# Patient Record
Sex: Female | Born: 1995
Health system: Southern US, Community
[De-identification: ages and names within clinical notes are randomized; demographics above are authoritative.]

## PROBLEM LIST (undated history)

## (undated) DIAGNOSIS — L732 Hidradenitis suppurativa: Secondary | ICD-10-CM

## (undated) DIAGNOSIS — K8020 Calculus of gallbladder without cholecystitis without obstruction: Secondary | ICD-10-CM

## (undated) HISTORY — DX: Hidradenitis suppurativa: L73.2

## (undated) HISTORY — PX: FOOT SURGERY: SHX648

## (undated) HISTORY — PX: TONSILLECTOMY: SUR1361

---
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
## 2017-01-29 DIAGNOSIS — G44229 Chronic tension-type headache, not intractable: Secondary | ICD-10-CM | POA: Insufficient documentation

## 2017-03-30 ENCOUNTER — Other Ambulatory Visit: Payer: Self-pay | Admitting: Nurse Practitioner

## 2017-03-30 DIAGNOSIS — G44201 Tension-type headache, unspecified, intractable: Secondary | ICD-10-CM

## 2017-04-08 ENCOUNTER — Other Ambulatory Visit: Payer: Self-pay | Admitting: Medical Oncology

## 2017-04-08 DIAGNOSIS — R945 Abnormal results of liver function studies: Principal | ICD-10-CM

## 2017-04-08 DIAGNOSIS — R7989 Other specified abnormal findings of blood chemistry: Secondary | ICD-10-CM

## 2017-04-08 DIAGNOSIS — R1084 Generalized abdominal pain: Secondary | ICD-10-CM

## 2017-04-12 ENCOUNTER — Ambulatory Visit
Admission: RE | Admit: 2017-04-12 | Discharge: 2017-04-12 | Disposition: A | Discharge status: Home / Self Care | Payer: No Typology Code available for payment source | Source: Ambulatory Visit | Attending: Nurse Practitioner | Admitting: Nurse Practitioner

## 2017-04-12 DIAGNOSIS — G44201 Tension-type headache, unspecified, intractable: Secondary | ICD-10-CM | POA: Insufficient documentation

## 2017-04-14 ENCOUNTER — Ambulatory Visit
Admission: RE | Admit: 2017-04-14 | Discharge: 2017-04-14 | Disposition: A | Discharge status: Home / Self Care | Payer: No Typology Code available for payment source | Source: Ambulatory Visit | Attending: Medical Oncology | Admitting: Medical Oncology

## 2017-04-14 DIAGNOSIS — R945 Abnormal results of liver function studies: Secondary | ICD-10-CM

## 2017-04-14 DIAGNOSIS — R7989 Other specified abnormal findings of blood chemistry: Secondary | ICD-10-CM | POA: Diagnosis not present

## 2017-04-14 DIAGNOSIS — K8020 Calculus of gallbladder without cholecystitis without obstruction: Secondary | ICD-10-CM | POA: Diagnosis not present

## 2017-04-14 DIAGNOSIS — R1084 Generalized abdominal pain: Secondary | ICD-10-CM | POA: Diagnosis not present

## 2017-04-22 ENCOUNTER — Observation Stay
Admission: EM | Admit: 2017-04-22 | Discharge: 2017-04-24 | Disposition: A | Discharge status: Home / Self Care | Payer: No Typology Code available for payment source | Attending: Surgery | Admitting: Surgery

## 2017-04-22 ENCOUNTER — Encounter: Payer: Self-pay | Admitting: Emergency Medicine

## 2017-04-22 ENCOUNTER — Emergency Department: Payer: No Typology Code available for payment source

## 2017-04-22 DIAGNOSIS — K8010 Calculus of gallbladder with chronic cholecystitis without obstruction: Secondary | ICD-10-CM | POA: Diagnosis not present

## 2017-04-22 DIAGNOSIS — K8020 Calculus of gallbladder without cholecystitis without obstruction: Secondary | ICD-10-CM | POA: Diagnosis present

## 2017-04-22 DIAGNOSIS — Z79899 Other long term (current) drug therapy: Secondary | ICD-10-CM | POA: Insufficient documentation

## 2017-04-22 DIAGNOSIS — R1011 Right upper quadrant pain: Secondary | ICD-10-CM

## 2017-04-22 DIAGNOSIS — Z793 Long term (current) use of hormonal contraceptives: Secondary | ICD-10-CM | POA: Diagnosis not present

## 2017-04-22 DIAGNOSIS — K8050 Calculus of bile duct without cholangitis or cholecystitis without obstruction: Secondary | ICD-10-CM

## 2017-04-22 HISTORY — DX: Calculus of gallbladder without cholecystitis without obstruction: K80.20

## 2017-04-22 LAB — COMPREHENSIVE METABOLIC PANEL
ALK PHOS: 57 U/L (ref 38–126)
ALT: 35 U/L (ref 14–54)
AST: 30 U/L (ref 15–41)
Albumin: 4.1 g/dL (ref 3.5–5.0)
Anion gap: 7 (ref 5–15)
BUN: 9 mg/dL (ref 6–20)
CALCIUM: 9.6 mg/dL (ref 8.9–10.3)
CHLORIDE: 109 mmol/L (ref 101–111)
CO2: 22 mmol/L (ref 22–32)
CREATININE: 0.76 mg/dL (ref 0.44–1.00)
GFR calc Af Amer: >60 mL/min (ref >60)
Glucose, Bld: 84 mg/dL (ref 65–99)
Potassium: 3.8 mmol/L (ref 3.5–5.1)
SODIUM: 138 mmol/L (ref 135–145)
Total Bilirubin: 0.6 mg/dL (ref 0.3–1.2)
Total Protein: 7.7 g/dL (ref 6.5–8.1)

## 2017-04-22 LAB — URINALYSIS, COMPLETE (UACMP) WITH MICROSCOPIC
Bacteria, UA: NONE SEEN
Bilirubin Urine: NEGATIVE
Glucose, UA: NEGATIVE mg/dL
Hgb urine dipstick: NEGATIVE
Ketones, ur: NEGATIVE mg/dL
Leukocytes, UA: NEGATIVE
Nitrite: NEGATIVE
Protein, ur: NEGATIVE mg/dL
Specific Gravity, Urine: 1.021 (ref 1.005–1.030)
WBC, UA: NONE SEEN WBC/hpf (ref 0–5)
pH: 6 (ref 5.0–8.0)

## 2017-04-22 LAB — CBC
HCT: 35.1 % (ref 35.0–47.0)
Hemoglobin: 12 g/dL (ref 12.0–16.0)
MCH: 29.3 pg (ref 26.0–34.0)
MCHC: 34.2 g/dL (ref 32.0–36.0)
MCV: 85.7 fL (ref 80.0–100.0)
Platelets: 324 K/uL (ref 150–440)
RBC: 4.1 MIL/uL (ref 3.80–5.20)
RDW: 14 % (ref 11.5–14.5)
WBC: 5.9 K/uL (ref 3.6–11.0)

## 2017-04-22 LAB — SURGICAL PCR SCREEN
MRSA, PCR: NEGATIVE
Staphylococcus aureus: NEGATIVE

## 2017-04-22 LAB — LIPASE, BLOOD: LIPASE: 26 U/L (ref 11–51)

## 2017-04-22 LAB — POC URINE PREG, ED: Preg Test, Ur: NEGATIVE

## 2017-04-22 MED ORDER — ONDANSETRON HCL 4 MG/2ML IJ SOLN
4.0000 mg | Freq: Once | INTRAMUSCULAR | Status: AC
  Administered 2017-04-22: 4 mg via INTRAVENOUS
  Filled 2017-04-22: qty 2

## 2017-04-22 MED ORDER — ONDANSETRON HCL 4 MG/2ML IJ SOLN: 4.0000 mg | Freq: Four times a day (QID) | INTRAMUSCULAR | Status: DC | PRN

## 2017-04-22 MED ORDER — VITAMIN D (ERGOCALCIFEROL) 1.25 MG (50000 UNIT) PO CAPS: 50000.0000 [IU] | ORAL_CAPSULE | ORAL | Status: DC

## 2017-04-22 MED ORDER — ZOLPIDEM TARTRATE 5 MG PO TABS
5.0000 mg | ORAL_TABLET | Freq: Every evening | ORAL | Status: DC | PRN
Start: 2017-04-22 — End: 2017-04-24

## 2017-04-22 MED ORDER — MORPHINE SULFATE (PF) 2 MG/ML IV SOLN: 1.0000 mg | INTRAVENOUS | Status: DC | PRN

## 2017-04-22 MED ORDER — DROSPIRENONE-ETHINYL ESTRADIOL 3-0.02 MG PO TABS: 1.0000 | ORAL_TABLET | Freq: Every day | ORAL | Status: DC

## 2017-04-22 MED ORDER — ACETAMINOPHEN 650 MG RE SUPP: 650.0000 mg | Freq: Four times a day (QID) | RECTAL | Status: DC | PRN

## 2017-04-22 MED ORDER — KCL IN DEXTROSE-NACL 20-5-0.2 MEQ/L-%-% IV SOLN
INTRAVENOUS | Status: DC
  Administered 2017-04-22 – 2017-04-23 (×2): via INTRAVENOUS
  Filled 2017-04-22 (×5): qty 1000

## 2017-04-22 MED ORDER — ISOTRETINOIN 40 MG PO CAPS: 40.0000 mg | ORAL_CAPSULE | Freq: Every day | ORAL | Status: DC

## 2017-04-22 MED ORDER — PROPRANOLOL HCL ER 60 MG PO CP24
60.0000 mg | ORAL_CAPSULE | Freq: Every day | ORAL | Status: DC
  Administered 2017-04-22 – 2017-04-23 (×2): 60 mg via ORAL
  Filled 2017-04-22 (×5): qty 1

## 2017-04-22 MED ORDER — HYDROCODONE-ACETAMINOPHEN 5-325 MG PO TABS
1.0000 | ORAL_TABLET | ORAL | Status: DC | PRN
  Administered 2017-04-22: 1 via ORAL
  Administered 2017-04-23: 2 via ORAL
  Filled 2017-04-22: qty 2
  Filled 2017-04-22: qty 1

## 2017-04-22 MED ORDER — MORPHINE SULFATE (PF) 4 MG/ML IV SOLN
4.0000 mg | Freq: Once | INTRAVENOUS | Status: AC
  Administered 2017-04-22: 4 mg via INTRAVENOUS
  Filled 2017-04-22: qty 1

## 2017-04-22 MED ORDER — SODIUM CHLORIDE 0.9 % IV BOLUS (SEPSIS)
1000.0000 mL | Freq: Once | INTRAVENOUS | Status: AC
  Administered 2017-04-22: 1000 mL via INTRAVENOUS

## 2017-04-22 MED ORDER — SPIRONOLACTONE 25 MG PO TABS
25.0000 mg | ORAL_TABLET | Freq: Every day | ORAL | Status: DC
  Administered 2017-04-22: 25 mg via ORAL
  Filled 2017-04-22 (×2): qty 1

## 2017-04-22 MED ORDER — ACETAMINOPHEN 325 MG PO TABS
650.0000 mg | ORAL_TABLET | Freq: Four times a day (QID) | ORAL | Status: DC | PRN
  Administered 2017-04-23: 650 mg via ORAL
  Filled 2017-04-22: qty 2

## 2017-04-22 MED ORDER — ONDANSETRON 4 MG PO TBDP: 4.0000 mg | ORAL_TABLET | Freq: Four times a day (QID) | ORAL | Status: DC | PRN

## 2017-04-22 MED ORDER — FENTANYL CITRATE (PF) 100 MCG/2ML IJ SOLN
50.0000 ug | Freq: Once | INTRAMUSCULAR | Status: AC
  Administered 2017-04-22: 50 ug via INTRAVENOUS
  Filled 2017-04-22: qty 2

## 2017-04-22 NOTE — ED Notes (Addendum)
Patient was sent by Dr. Renda RollsWilton Smith after she was having abdominal pain.  Patient has known gallstones.  Dr. Katrinka BlazingSmith asked that ED physician would call him when patient arrives to the ED.

## 2017-04-22 NOTE — H&P (Signed)
Kristina Krueger is an 21 y.o. female.   Chief Complaint: Right upper abdominal pain HPI: She has history of several months epigastric pain in the left upper quadrant and the right upper quadrant. Since the epigastric pains are unpredictable and not particularly related to meals. She recently had ultrasound demonstrated multiple gallstones. Also had some liver enzyme abnormality. She also had some recent diarrhea which has improved. She was recently seen in the office and scheduled for laparoscopic cholecystectomy 2 weeks from today. She reports that last night she developed right upper quadrant abdominal pain which is persisted through the night. She reported loss of appetite and some nausea but no actual vomiting. She reports limited oral intake. Reports low-grade fever. She reported she did have some diarrhea today. Her mother called reported persistent pain today and I had her come to the emergency room for further evaluation. While in the emergency room she has received intravenous morphine which is helped the pain. Also has had another ultrasound was again demonstrates gallstones and slight thickening of the gallbladder wall. At the time of surgical evaluation she does have some persistent right upper quadrant pain. Current pain level is down to a 6.  Past Medical History:  Diagnosis Date  . Cholelithiases   . Hidradenitis suppurativa   I reviewed history of hidradenitis suppurativa and current treatment with Accutane.  She also has some history of headaches. She reports no history of heart disease or lung disease or hepatitis. She has recently been tested with hepatitis panel and ruled out hepatitis A, B, and C.  She also has some history of depression.  Past Surgical History:  Procedure Laterality Date  . FOOT SURGERY    . TONSILLECTOMY      No family history on file. Family history cancer in her grandmother otherwise no specific illness reported. Social History:  reports that she has  never smoked. She has never used smokeless tobacco. She reports that she does not drink alcohol or use drugs.  Allergies: No Known Allergies   (Not in a hospital admission)  Results for orders placed or performed during the hospital encounter of 04/22/17 (from the past 48 hour(s))  Lipase, blood     Status: None   Collection Time: 04/22/17  1:45 PM  Result Value Ref Range   Lipase 26 11 - 51 U/L  Comprehensive metabolic panel     Status: None   Collection Time: 04/22/17  1:45 PM  Result Value Ref Range   Sodium 138 135 - 145 mmol/L   Potassium 3.8 3.5 - 5.1 mmol/L   Chloride 109 101 - 111 mmol/L   CO2 22 22 - 32 mmol/L   Glucose, Bld 84 65 - 99 mg/dL   BUN 9 6 - 20 mg/dL   Creatinine, Ser 0.76 0.44 - 1.00 mg/dL   Calcium 9.6 8.9 - 10.3 mg/dL   Total Protein 7.7 6.5 - 8.1 g/dL   Albumin 4.1 3.5 - 5.0 g/dL   AST 30 15 - 41 U/L   ALT 35 14 - 54 U/L   Alkaline Phosphatase 57 38 - 126 U/L   Total Bilirubin 0.6 0.3 - 1.2 mg/dL   GFR calc non Af Amer >60 >60 mL/min   GFR calc Af Amer >60 >60 mL/min    Comment: (NOTE) The eGFR has been calculated using the CKD EPI equation. This calculation has not been validated in all clinical situations. eGFR's persistently <60 mL/min signify possible Chronic Kidney Disease.    Anion gap 7  5 - 15  CBC     Status: None   Collection Time: 04/22/17  1:45 PM  Result Value Ref Range   WBC 5.9 3.6 - 11.0 K/uL   RBC 4.10 3.80 - 5.20 MIL/uL   Hemoglobin 12.0 12.0 - 16.0 g/dL   HCT 35.1 35.0 - 47.0 %   MCV 85.7 80.0 - 100.0 fL   MCH 29.3 26.0 - 34.0 pg   MCHC 34.2 32.0 - 36.0 g/dL   RDW 14.0 11.5 - 14.5 %   Platelets 324 150 - 440 K/uL  Urinalysis, Complete w Microscopic     Status: Abnormal   Collection Time: 04/22/17  1:45 PM  Result Value Ref Range   Color, Urine YELLOW (A) YELLOW   APPearance CLEAR (A) CLEAR   Specific Gravity, Urine 1.021 1.005 - 1.030   pH 6.0 5.0 - 8.0   Glucose, UA NEGATIVE NEGATIVE mg/dL   Hgb urine dipstick  NEGATIVE NEGATIVE   Bilirubin Urine NEGATIVE NEGATIVE   Ketones, ur NEGATIVE NEGATIVE mg/dL   Protein, ur NEGATIVE NEGATIVE mg/dL   Nitrite NEGATIVE NEGATIVE   Leukocytes, UA NEGATIVE NEGATIVE   RBC / HPF 0-5 0 - 5 RBC/hpf   WBC, UA NONE SEEN 0 - 5 WBC/hpf   Bacteria, UA NONE SEEN NONE SEEN   Squamous Epithelial / LPF 0-5 (A) NONE SEEN   Mucous PRESENT   POC urine preg, ED     Status: None   Collection Time: 04/22/17  3:46 PM  Result Value Ref Range   Preg Test, Ur Negative Negative   US Abdomen Limited Ruq  Result Date: 04/22/2017 CLINICAL DATA:  Right upper quadrant abdominal pain EXAM: US ABDOMEN LIMITED - RIGHT UPPER QUADRANT COMPARISON:  04/14/2017 FINDINGS: Gallbladder: Contracted gallbladder. Diffuse shadowing at the gallbladder fossa is presumably due to multiple stones in the gallbladder lumen. Negative sonographic Murphy's. Wall thickness normal at 2.6 mm. Common bile duct: Diameter: Normal at 3.5 mm Liver: No focal lesion identified. Within normal limits in parenchymal echogenicity. IMPRESSION: Contracted stone filled gallbladder without wall thickening or sonographic Murphy's. No biliary dilatation. Electronically Signed   By: Donavan Foil M.D.   On: 04/22/2017 16:49    ROS she does report some headaches also problems with anxiety. She has a history of acne and hidradenitis currently being treated. She reports no dyspnea on exertion and no chest pains. She reports she is voiding satisfactorily and has had some recent diarrhea. Last menstrual period was on May 8. She reports occasional swelling in her feet but not in her ankles. She is otherwise walking satisfactorily. Review of systems otherwise negative.  Blood pressure 99/64, pulse (!) 55, temperature 98.7 F (37.1 C), temperature source Oral, resp. rate 14, height '6\' 1"'  (1.854 m), weight 233 lb (105.7 kg), last menstrual period 03/30/2017, SpO2 100 %. Physical Exam she is resting on the emergency room stretcher in mild degree  of distress.  SKIN: Warm and dry without rash. Some acne was noted.  HEENT: Pupils equal reactive to light. Extraocular movements are intact. Sclera clear. Palpebral conjunctiva normal red color.Pharynx clear.  NECK: Supple with no palpable mass and no adenopathy.  LUNGS: Sound clear with no rales rhonchi or wheezes.  HEART: Regular rhythm S1 and S2 without murmur.  ABDOMEN: She does have moderate right upper quadrant tenderness. There is no significant tenderness elsewhere in the abdomen. There is no palpable mass.  EXTREMITIES well-developed well-nourished symmetrical with no deep and edema  NEUROLOGICAL: Awake alert and oriented, facial  expression symmetrical, moving all extremities, cranial nerves II through XII intact   Assessment/Plan Cholecystitis cholelithiasis  Seeing that she is now had recurrent pain and fairly severe and persistent needing to come to the emergency room I recommended bringing her into the hospital for overnight observation and carry her to the operating room tomorrow for laparoscopic cholecystectomy. I have discussed the operation care risk and benefits with her in detail. I have discussed this with the OR staff to get it scheduled for tomorrow. Also discussed with the ER staff the plan for admission.  Rochel Brome, MD 04/22/2017, 6:01 PM

## 2017-04-22 NOTE — ED Notes (Signed)
Surgeon at bedside.  

## 2017-04-22 NOTE — ED Provider Notes (Signed)
[facility] Emergency Department Provider Note ____________________________________________   I have reviewed the triage vital signs and the triage nursing note.  HISTORY  Chief Complaint Cholelithiasis   Historian Patient  HPI TORAH PINNOCK is a 21 y.o. female presents with ruq pain since yesterday evening, persistent and ongoing with some waxing and waning.  Moderate at present.  Has not taken anything at home for pain.  Reports fever of 101 last night. Currently afebrile. Nausea without vomiting. Symptoms diarrhea overnight. Denies lower abdominal pain, pelvic pain, vaginal discharge, or urinary symptoms.  Denies pain radiating up into the chest.    Past Medical History:  Diagnosis Date  . Cholelithiases   . Hidradenitis suppurativa     There are no active problems to display for this patient.   Past Surgical History:  Procedure Laterality Date  . FOOT SURGERY    . TONSILLECTOMY      Prior to Admission medications   Medication Sig Start Date End Date Taking? Authorizing Provider  [employer] WITH PUMP gel APPLY TOPICALLY TO FACE QAM FOLLOWED BY LOTION WITH SPF 08/05/15   [provider]  clindamycin (CLEOCIN T) 1 % lotion Apply 1 application topically 2 (two) times daily. Apply to affected axilla skin 07/28/15   [provider]  doxycycline (VIBRAMYCIN) 100 MG capsule Take 100 mg by mouth 2 (two) times daily. Take with food and drink. (routine med) 08/17/15   [provider]    No Known Allergies  No family history on file.  Social History Social History  Substance Use Topics  . Smoking status: Never Smoker  . Smokeless tobacco: Never Used  . Alcohol use No    Review of Systems  Constitutional:Fever last night as per history of present illness, currently afebrile. Eyes: Negative for visual changes. ENT: Negative for sore throat. Cardiovascular: Negative for chest pain. Respiratory: Negative for shortness of  breath. Gastrointestinal: Right upper quadrant pain as per history of present illness. She has had pain for about a year on and off and had an ultrasound confirming gallstones, followed by Dr. Corwin Levins of general surgery, and has surgery scheduled for the gallbladder in June. She spoke with the office this morning given ongoing pain and was requested to come to the emergency department. Genitourinary: Negative for dysuria. Musculoskeletal: Negative for back pain. Skin: Negative for rash. Neurological: Negative for headache.  ____________________________________________   PHYSICAL EXAM:  VITAL SIGNS: ED Triage Vitals  Enc Vitals Group     BP 04/22/17 1350 109/64     Pulse Rate 04/22/17 1350 72     Resp 04/22/17 1350 16     Temp 04/22/17 1350 98.7 F (37.1 C)     Temp Source 04/22/17 1350 Oral     SpO2 04/22/17 1350 97 %     Weight 04/22/17 1347 233 lb (105.7 kg)     Height 04/22/17 1347 6\' 1"  (1.854 m)     Head Circumference --      Peak Flow --      Pain Score 04/22/17 1347 10     Pain Loc --      Pain Edu? --      Excl. in GC? --      Constitutional: Alert and oriented. Well appearing and in no distress. HEENT   Head: Normocephalic and atraumatic.      Eyes: Conjunctivae are normal. Pupils equal and round.       Ears:         Nose:  No congestion/rhinnorhea.   Mouth/Throat: Mucous membranes are moist.   Neck: No stridor. Cardiovascular/Chest: Normal rate, regular rhythm.  No murmurs, rubs, or gallops. Respiratory: Normal respiratory effort without tachypnea nor retractions. Breath sounds are clear and equal bilaterally. No wheezes/rales/rhonchi. Gastrointestinal: Soft. No distention, no guarding, no rebound. Mildly obese.  Tender in the ruq.  No lower abdominal tenderness on palpation.  Genitourinary/rectal:Deferred Musculoskeletal: Nontender with normal range of motion in all extremities. No joint effusions.  No lower extremity tenderness.  No  edema. Neurologic:  Normal speech and language. No gross or focal neurologic deficits are appreciated. Skin:  Skin is warm, dry and intact. No rash noted. Psychiatric: Mood and affect are normal. Speech and behavior are normal. Patient exhibits appropriate insight and judgment.   ____________________________________________  LABS (pertinent positives/negatives)  Labs Reviewed  URINALYSIS, COMPLETE (UACMP) WITH MICROSCOPIC - Abnormal; Notable for the following:       Result Value   Color, Urine YELLOW (*)    APPearance CLEAR (*)    Squamous Epithelial / LPF 0-5 (*)    All other components within normal limits  LIPASE, BLOOD  COMPREHENSIVE METABOLIC PANEL  CBC  POC URINE PREG, ED    ____________________________________________    EKG I, Governor Rooksebecca Verlee Pope, MD, the attending physician have personally viewed and interpreted all ECGs.  None ____________________________________________  RADIOLOGY All Xrays were viewed by me. Imaging interpreted by Radiologist.  US ruq:   IMPRESSION: Contracted stone filled gallbladder without wall thickening or sonographic Murphy's. No biliary dilatation. __________________________________________  PROCEDURES  Procedure(s) performed: None  Critical Care performed: None  ____________________________________________   ED COURSE / ASSESSMENT AND PLAN  Pertinent labs & imaging results that were available during my care of the patient were reviewed by me and considered in my medical decision making (see chart for details).   Overall well appearing, but complaining of pain x1 way consistent with biliary colic of prior 1 year.  No fever here.  Labs reassuring with no elevated wbc and lfts within normal limits.  Does not give symptoms likely for gerd, acs or lower abdominal emergency.   Laboratory studies are all reassuring, but patient continues to have pain despite 2 doses of narcotic medication.  Ultrasound shows gallstones without  signs of obstruction or cold the cystitis. Dr. Katrinka BlazingSmith saw the patient and will go ahead and admit given persistent pain.    CONSULTATIONS:  Dr. Corwin LevinsWilliam Smith, saw patient in the ED, plans to admit for likely surgery tomorrow.  ___________________________________________   FINAL CLINICAL IMPRESSION(S) / ED DIAGNOSES   Final diagnoses:  RUQ pain  Biliary colic  Gallstones              Note: This dictation was prepared with Dragon dictation. Any transcriptional errors that result from this process are unintentional    Governor RooksLord, Andrae Claunch, MD 04/22/17 1746

## 2017-04-22 NOTE — ED Triage Notes (Signed)
Pt comes into the ED via POV c/o gallbladder problems.  Dr. Katrinka BlazingSmith asked the patient to come here to be admitted.  Patient c/o RUQ abdominal pain.  H/o gallstones and she was supposed to have surgery in June.  Patient in NAD at this time with even and unlabored respirations.  Patient states the pain started yesterday and has yet to dissipate.

## 2017-04-23 ENCOUNTER — Encounter: Payer: Self-pay | Admitting: *Deleted

## 2017-04-23 ENCOUNTER — Observation Stay: Payer: No Typology Code available for payment source | Admitting: Anesthesiology

## 2017-04-23 ENCOUNTER — Encounter
Admission: EM | Disposition: A | Discharge status: Home / Self Care | Payer: Self-pay | Source: Home / Self Care | Attending: Emergency Medicine

## 2017-04-23 ENCOUNTER — Observation Stay: Payer: No Typology Code available for payment source

## 2017-04-23 HISTORY — PX: CHOLECYSTECTOMY: SHX55

## 2017-04-23 LAB — GLUCOSE, CAPILLARY: Glucose-Capillary: 94 mg/dL (ref 65–99)

## 2017-04-23 SURGERY — LAPAROSCOPIC CHOLECYSTECTOMY WITH INTRAOPERATIVE CHOLANGIOGRAM
Anesthesia: General | Site: Abdomen | Wound class: Clean Contaminated

## 2017-04-23 MED ORDER — HYDROCODONE-ACETAMINOPHEN 5-325 MG PO TABS: 1.0000 | ORAL_TABLET | ORAL | 0 refills | Status: DC | PRN

## 2017-04-23 MED ORDER — ROCURONIUM BROMIDE 50 MG/5ML IV SOLN
INTRAVENOUS | Status: AC
  Filled 2017-04-23: qty 1

## 2017-04-23 MED ORDER — FENTANYL CITRATE (PF) 100 MCG/2ML IJ SOLN
INTRAMUSCULAR | Status: DC | PRN
Start: 2017-04-23 — End: 2017-04-23
  Administered 2017-04-23: 25 ug via INTRAVENOUS
  Administered 2017-04-23: 50 ug via INTRAVENOUS
  Administered 2017-04-23: 25 ug via INTRAVENOUS

## 2017-04-23 MED ORDER — HYDROCODONE-ACETAMINOPHEN 5-325 MG PO TABS: 1.0000 | ORAL_TABLET | ORAL | Status: DC | PRN

## 2017-04-23 MED ORDER — SUGAMMADEX SODIUM 200 MG/2ML IV SOLN
INTRAVENOUS | Status: DC | PRN
  Administered 2017-04-23: 200 mg via INTRAVENOUS

## 2017-04-23 MED ORDER — SEVOFLURANE IN SOLN
RESPIRATORY_TRACT | Status: AC
  Filled 2017-04-23: qty 250

## 2017-04-23 MED ORDER — LIDOCAINE HCL (PF) 2 % IJ SOLN
INTRAMUSCULAR | Status: AC
  Filled 2017-04-23: qty 2

## 2017-04-23 MED ORDER — MIDAZOLAM HCL 2 MG/2ML IJ SOLN
INTRAMUSCULAR | Status: AC
  Filled 2017-04-23: qty 2

## 2017-04-23 MED ORDER — ROCURONIUM BROMIDE 100 MG/10ML IV SOLN
INTRAVENOUS | Status: DC | PRN
  Administered 2017-04-23: 50 mg via INTRAVENOUS
  Administered 2017-04-23: 10 mg via INTRAVENOUS
  Administered 2017-04-23: 5 mg via INTRAVENOUS

## 2017-04-23 MED ORDER — PROPOFOL 10 MG/ML IV BOLUS
INTRAVENOUS | Status: AC
  Filled 2017-04-23: qty 20

## 2017-04-23 MED ORDER — PROMETHAZINE HCL 25 MG/ML IJ SOLN: 6.2500 mg | INTRAMUSCULAR | Status: DC | PRN

## 2017-04-23 MED ORDER — ONDANSETRON HCL 4 MG/2ML IJ SOLN
INTRAMUSCULAR | Status: AC
  Filled 2017-04-23: qty 2

## 2017-04-23 MED ORDER — HEPARIN SODIUM (PORCINE) 5000 UNIT/ML IJ SOLN
INTRAMUSCULAR | Status: AC
  Filled 2017-04-23: qty 1

## 2017-04-23 MED ORDER — SUGAMMADEX SODIUM 200 MG/2ML IV SOLN
INTRAVENOUS | Status: AC
  Filled 2017-04-23: qty 2

## 2017-04-23 MED ORDER — FENTANYL CITRATE (PF) 100 MCG/2ML IJ SOLN
INTRAMUSCULAR | Status: AC
  Filled 2017-04-23: qty 2

## 2017-04-23 MED ORDER — MEPERIDINE HCL 50 MG/ML IJ SOLN: 6.2500 mg | INTRAMUSCULAR | Status: DC | PRN

## 2017-04-23 MED ORDER — LACTATED RINGERS IV SOLN
INTRAVENOUS | Status: DC
  Administered 2017-04-23: 12:00:00 via INTRAVENOUS

## 2017-04-23 MED ORDER — DEXAMETHASONE SODIUM PHOSPHATE 10 MG/ML IJ SOLN
INTRAMUSCULAR | Status: DC | PRN
  Administered 2017-04-23: 10 mg via INTRAVENOUS

## 2017-04-23 MED ORDER — FENTANYL CITRATE (PF) 100 MCG/2ML IJ SOLN
25.0000 ug | INTRAMUSCULAR | Status: DC | PRN
  Administered 2017-04-23 (×2): 50 ug via INTRAVENOUS

## 2017-04-23 MED ORDER — SODIUM CHLORIDE 0.9 % IV SOLN
INTRAVENOUS | Status: DC | PRN
  Administered 2017-04-23: 14:00:00 via INTRAMUSCULAR

## 2017-04-23 MED ORDER — OXYCODONE HCL 5 MG PO TABS: 5.0000 mg | ORAL_TABLET | Freq: Once | ORAL | Status: DC | PRN

## 2017-04-23 MED ORDER — DEXAMETHASONE SODIUM PHOSPHATE 10 MG/ML IJ SOLN
INTRAMUSCULAR | Status: AC
  Filled 2017-04-23: qty 1

## 2017-04-23 MED ORDER — PROPOFOL 10 MG/ML IV BOLUS
INTRAVENOUS | Status: DC | PRN
  Administered 2017-04-23: 180 mg via INTRAVENOUS

## 2017-04-23 MED ORDER — LIDOCAINE HCL (CARDIAC) 20 MG/ML IV SOLN
INTRAVENOUS | Status: DC | PRN
  Administered 2017-04-23: 100 mg via INTRAVENOUS

## 2017-04-23 MED ORDER — MIDAZOLAM HCL 2 MG/2ML IJ SOLN
INTRAMUSCULAR | Status: DC | PRN
  Administered 2017-04-23: 2 mg via INTRAVENOUS

## 2017-04-23 MED ORDER — FENTANYL CITRATE (PF) 100 MCG/2ML IJ SOLN
INTRAMUSCULAR | Status: AC
  Administered 2017-04-23: 50 ug via INTRAVENOUS
  Filled 2017-04-23: qty 2

## 2017-04-23 MED ORDER — OXYCODONE HCL 5 MG/5ML PO SOLN: 5.0000 mg | Freq: Once | ORAL | Status: DC | PRN

## 2017-04-23 MED ORDER — IOTHALAMATE MEGLUMINE 60 % INJ SOLN
INTRAMUSCULAR | Status: DC | PRN
  Administered 2017-04-23: 14:00:00

## 2017-04-23 SURGICAL SUPPLY — 38 items
APPLIER CLIP ROT 10 11.4 M/L (STAPLE) ×2
BENZOIN TINCTURE PRP APPL 2/3 (GAUZE/BANDAGES/DRESSINGS) ×2 IMPLANT
CANISTER SUCT 1200ML W/VALVE (MISCELLANEOUS) ×2 IMPLANT
CANNULA DILATOR 10 W/SLV (CANNULA) ×2 IMPLANT
CATH REDDICK CHOLANGI 4FR 50CM (CATHETERS) ×2 IMPLANT
CHLORAPREP W/TINT 26ML (MISCELLANEOUS) ×2 IMPLANT
CLIP APPLIE ROT 10 11.4 M/L (STAPLE) ×1 IMPLANT
DERMABOND ADVANCED (GAUZE/BANDAGES/DRESSINGS) ×1
DERMABOND ADVANCED .7 DNX12 (GAUZE/BANDAGES/DRESSINGS) ×1 IMPLANT
DRAPE SHEET LG 3/4 BI-LAMINATE (DRAPES) ×2 IMPLANT
ELECT REM PT RETURN 9FT ADLT (ELECTROSURGICAL) ×2
ELECTRODE REM PT RTRN 9FT ADLT (ELECTROSURGICAL) ×1 IMPLANT
GAUZE SPONGE 4X4 12PLY STRL (GAUZE/BANDAGES/DRESSINGS) ×2 IMPLANT
GLOVE BIO SURGEON STRL SZ7.5 (GLOVE) ×2 IMPLANT
GOWN STRL REUS W/ TWL LRG LVL3 (GOWN DISPOSABLE) ×4 IMPLANT
GOWN STRL REUS W/TWL LRG LVL3 (GOWN DISPOSABLE) ×4
IRRIGATION STRYKERFLOW (MISCELLANEOUS) ×1 IMPLANT
IRRIGATOR STRYKERFLOW (MISCELLANEOUS) ×2
IV NS 1000ML (IV SOLUTION) ×1
IV NS 1000ML BAXH (IV SOLUTION) ×1 IMPLANT
KIT RM TURNOVER STRD PROC AR (KITS) ×2 IMPLANT
LABEL OR SOLS (LABEL) ×2 IMPLANT
NDL INSUFF ACCESS 14 VERSASTEP (NEEDLE) ×2 IMPLANT
NEEDLE FILTER BLUNT 18X 1/2SAF (NEEDLE) ×1
NEEDLE FILTER BLUNT 18X1 1/2 (NEEDLE) ×1 IMPLANT
NS IRRIG 500ML POUR BTL (IV SOLUTION) ×2 IMPLANT
PACK LAP CHOLECYSTECTOMY (MISCELLANEOUS) ×2 IMPLANT
SCISSORS METZENBAUM CVD 33 (INSTRUMENTS) ×2 IMPLANT
SEAL FOR SCOPE WARMER C3101 (MISCELLANEOUS) ×2 IMPLANT
SLEEVE ENDOPATH XCEL 5M (ENDOMECHANICALS) ×2 IMPLANT
STRIP CLOSURE SKIN 1/2X4 (GAUZE/BANDAGES/DRESSINGS) ×2 IMPLANT
SUT CHROMIC 5 0 RB 1 27 (SUTURE) ×2 IMPLANT
SUT VIC AB 0 CT2 27 (SUTURE) IMPLANT
SYR 3ML LL SCALE MARK (SYRINGE) ×2 IMPLANT
TROCAR XCEL NON-BLD 11X100MML (ENDOMECHANICALS) ×2 IMPLANT
TROCAR XCEL NON-BLD 5MMX100MML (ENDOMECHANICALS) ×2 IMPLANT
TUBING INSUFFLATOR HI FLOW (MISCELLANEOUS) ×2 IMPLANT
WATER STERILE IRR 1000ML POUR (IV SOLUTION) ×2 IMPLANT

## 2017-04-23 NOTE — Op Note (Signed)
OPERATIVE REPORT  PREOPERATIVE DIAGNOSIS:  Chronic cholecystitis cholelithiasis  POSTOPERATIVE DIAGNOSIS: Chronic cholecystitis cholelithiasis  PROCEDURE: Laparoscopic cholecystectomy with cholangiogram  ANESTHESIA: General  SURGEON: Renda RollsWilton Smith M.D.  INDICATIONS: She is a history of epigastric pain and ultrasound findings of gallstones. Surgery was recommended for definitive treatment.  With the patient on the operating table in the supine position under general endotracheal anesthesia the abdomen was prepared with ChloraPrep solution and draped in a sterile manner. A short incision was made in the inferior aspect of the umbilicus and carried down to the deep fascia which was grasped with a laryngeal hook. A Veress needle was inserted aspirated and irrigated with a saline solution. The peritoneal cavity was insufflated with carbon dioxide. The Veress needle was removed. The 10 mm cannula was inserted. The 10 mm 0 laparoscope was inserted to view the peritoneal cavity.  Another incision was made in the epigastrium slightly to the right of the midline to introduce an 11 mm cannula. 2 incisions were made in the lateral aspect of the right upper quadrant to introduce 2   5 mm cannulas. Initial inspection revealed the smooth surface of the liver.The gallbladder was retracted towards the right shoulder. Multiple adhesions were taken down with blunt and sharp dissection and hook and cautery. The gallbladder neck was retracted inferiorly and laterally.  The porta hepatis was identified. There was a large stone which appeared to be impacted in the neck of the gallbladder. The gallbladder was mobilized with incision of the visceral peritoneum. The cystic duct was dissected free from surrounding structures. The cystic artery was dissected free from surrounding structures. A critical view of safety was demonstrated  An Endo Clip was placed across the cystic duct adjacent to the gallbladder neck. An incision was  made in the cystic duct to introduce a Reddick catheter. However the Reddick catheter would not thread into the cystic duct. Therefore a cholangiogram was not done. The Reddick catheter was removed. The cystic duct was doubly ligated with endoclips and divided. The cystic artery was controlled with double endoclips and divided. The gallbladder was dissected free from the liver with use of hook and cautery and blunt dissection. Bleeding was minimal and hemostasis was intact. The gallbladder was delivered up through the infraumbilical incision pulling the fundus of the gallbladder out first. It was opened and suctioned. Next small mobile stones were removed with the stone forceps and with the stone scoop. There was still a large stone impacted in the neck of the gallbladder. A grasper was inserted and grasped the gallbladder neck and rolled the gallbladder around the grasper disimpacting the stone. This was subsequently grasped with the stone scoop and crushed and removed in a piecemeal fashion. The gallbladder and stones were submitted for routine pathology. The right upper quadrant was further inspected and could see hemostasis was intact. The cannulas were removed allowing carbon dioxide to escape from the peritoneal cavity. The skin incisions were closed with interrupted 5-0 chromic subcutaneous suture benzoin and Steri-Strips. Gauze dressings were applied with paper tape.  The patient appeared to be in satisfactory condition and was prepared for transfer to the recovery room  Renda RollsWilton Smith M.D.

## 2017-04-23 NOTE — Anesthesia Preprocedure Evaluation (Signed)
Anesthesia Evaluation  Patient identified by MRN, date of birth, ID band Patient awake    Reviewed: Allergy & Precautions, NPO status , Patient's Chart, lab work & pertinent test results  History of Anesthesia Complications Negative for: history of anesthetic complications  Airway Mallampati: II  TM Distance: >3 FB Neck ROM: Full    Dental no notable dental hx.    Pulmonary neg pulmonary ROS, neg sleep apnea, neg COPD,    breath sounds clear to auscultation- rhonchi (-) wheezing      Cardiovascular Exercise Tolerance: Good (-) hypertension(-) CAD and (-) Past MI  Rhythm:Regular Rate:Normal - Systolic murmurs and - Diastolic murmurs    Neuro/Psych negative neurological ROS  negative psych ROS   GI/Hepatic negative GI ROS, Neg liver ROS,   Endo/Other  negative endocrine ROSneg diabetes  Renal/GU negative Renal ROS     Musculoskeletal negative musculoskeletal ROS (+)   Abdominal (+) + obese,   Peds  Hematology negative hematology ROS (+)   Anesthesia Other Findings Past Medical History: No date: Cholelithiases No date: Hidradenitis suppurativa   Reproductive/Obstetrics                             Anesthesia Physical Anesthesia Plan  ASA: II  Anesthesia Plan: General   Post-op Pain Management:    Induction: Intravenous  Airway Management Planned: Oral ETT  Additional Equipment:   Intra-op Plan:   Post-operative Plan: Extubation in OR  Informed Consent: I have reviewed the patients History and Physical, chart, labs and discussed the procedure including the risks, benefits and alternatives for the proposed anesthesia with the patient or authorized representative who has indicated his/her understanding and acceptance.   Dental advisory given  Plan Discussed with: CRNA and Anesthesiologist  Anesthesia Plan Comments:         Anesthesia Quick Evaluation

## 2017-04-23 NOTE — Anesthesia Procedure Notes (Addendum)
Procedure Name: Intubation Date/Time: 04/23/2017 1:35 PM Performed by: Doreen Salvage Pre-anesthesia Checklist: Patient identified, Patient being monitored, Timeout performed, Emergency Drugs available and Suction available Patient Re-evaluated:Patient Re-evaluated prior to inductionOxygen Delivery Method: Circle system utilized Preoxygenation: Pre-oxygenation with 100% oxygen Intubation Type: IV induction Ventilation: Mask ventilation without difficulty Laryngoscope Size: Mac and 3 Grade View: Grade I Tube type: Oral Tube size: 7.0 mm Number of attempts: 1 Airway Equipment and Method: Stylet (Soft biteblock placed betwee left molars.) Placement Confirmation: ETT inserted through vocal cords under direct vision,  positive ETCO2 and breath sounds checked- equal and bilateral Secured at: 22 cm Tube secured with: Tape Dental Injury: Teeth and Oropharynx as per pre-operative assessment

## 2017-04-23 NOTE — Anesthesia Post-op Follow-up Note (Cosign Needed)
Anesthesia QCDR form completed.        

## 2017-04-23 NOTE — Anesthesia Postprocedure Evaluation (Signed)
Anesthesia Post Note  Patient: Inza C Boozer  Procedure(s) Performed: Procedure(s) (LRB): LAPAROSCOPIC CHOLECYSTECTOMY (N/A)  Patient location during evaluation: PACU Anesthesia Type: General Level of consciousness: awake and alert Pain management: pain level controlled Vital Signs Assessment: post-procedure vital signs reviewed and stable Respiratory status: spontaneous breathing, nonlabored ventilation, respiratory function stable and patient connected to nasal cannula oxygen Cardiovascular status: blood pressure returned to baseline and stable Postop Assessment: no signs of nausea or vomiting Anesthetic complications: no     Last Vitals:  Vitals:   04/23/17 1620 04/23/17 1637  BP: 113/71 114/71  Pulse: 75 72  Resp: 20 18  Temp: 36.7 C 36.8 C    Last Pain:  Vitals:   04/23/17 1637  TempSrc: Oral  PainSc:                  Cleda MccreedyJoseph K Emeree Mahler

## 2017-04-23 NOTE — Transfer of Care (Signed)
Immediate Anesthesia Transfer of Care Note  Patient: Kristina RibasJada C Grout  Procedure(s) Performed: Procedure(s): LAPAROSCOPIC CHOLECYSTECTOMY (N/A)  Patient Location: PACU  Anesthesia Type:General  Level of Consciousness: sedated  Airway & Oxygen Therapy: Patient Spontanous Breathing and Patient connected to face mask oxygen  Post-op Assessment: Report given to RN and Post -op Vital signs reviewed and stable  Post vital signs: Reviewed and stable  Last Vitals:  Vitals:   04/23/17 1151 04/23/17 1539  BP: 110/75 (!) 105/55  Pulse: 62 68  Resp: 16 12  Temp: 37 C 36.2 C    Complications: No apparent anesthesia complications

## 2017-04-24 ENCOUNTER — Encounter: Payer: Self-pay | Admitting: Surgery

## 2017-04-24 LAB — HIV ANTIBODY (ROUTINE TESTING W REFLEX): HIV Screen 4th Generation wRfx: NONREACTIVE

## 2017-04-24 NOTE — Progress Notes (Signed)
04/24/2017 10:37 AM  BP (!) 107/56 (BP Location: Left Arm)   Pulse 62   Temp 98.6 F (37 C) (Oral)   Resp 20   Ht 6\' 1"  (1.854 m)   Wt 117.5 kg (259 lb)   LMP 03/30/2017 (Approximate)   SpO2 100%   BMI 34.17 kg/m  Patient discharged per MD orders. Discharge instructions reviewed with patient and patient verbalized understanding. IV removed per policy. Prescriptions discussed and given to patient. Discharged via wheelchair escorted by nursing staff.  Ron ParkerHerron, Nolyn Swab D, RN

## 2017-04-28 LAB — SURGICAL PATHOLOGY

## 2017-04-29 ENCOUNTER — Inpatient Hospital Stay: Admission: RE | Admit: 2017-04-29 | Payer: No Typology Code available for payment source | Source: Ambulatory Visit

## 2017-05-06 ENCOUNTER — Ambulatory Visit
Admission: RE | Admit: 2017-05-06 | Payer: No Typology Code available for payment source | Source: Ambulatory Visit | Admitting: Surgery

## 2017-05-06 ENCOUNTER — Encounter: Admission: RE | Payer: Self-pay | Source: Ambulatory Visit

## 2017-05-06 SURGERY — LAPAROSCOPIC CHOLECYSTECTOMY WITH INTRAOPERATIVE CHOLANGIOGRAM
Anesthesia: Choice

## 2018-01-26 IMAGING — US US ABDOMEN LIMITED
1 series · 14 of 25 positions shown · non-contrast
Comparison: 04/14/2017

CLINICAL DATA: Right upper quadrant abdominal pain

EXAM:
US ABDOMEN LIMITED - RIGHT UPPER QUADRANT

[Series 1: us abdomen limited · 0.19mm/px · 14 of 40 slices shown]
[im 1/40]
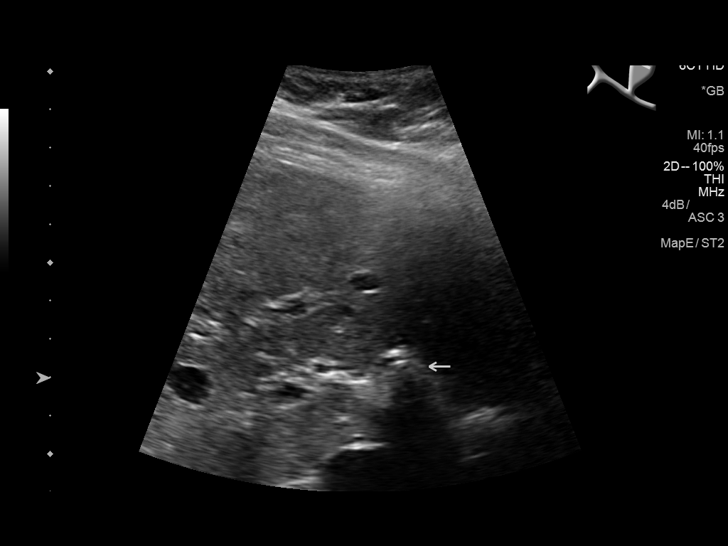
[im 4/40]
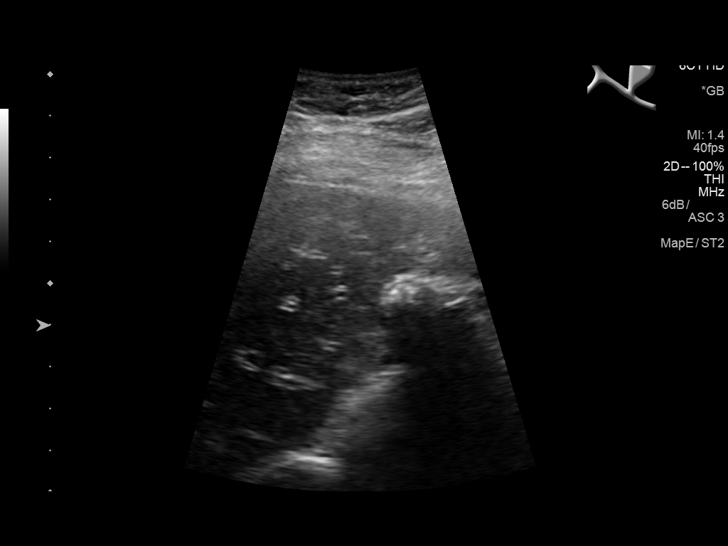
[im 7/40]
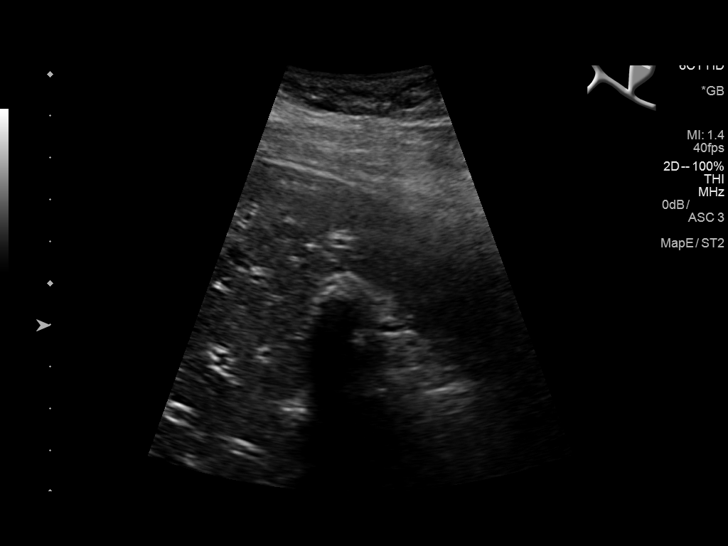
[im 10/40]
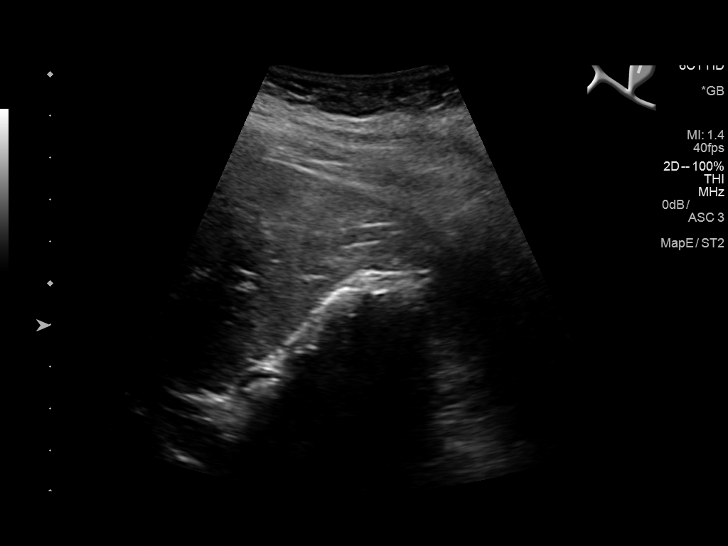
[im 14/40]
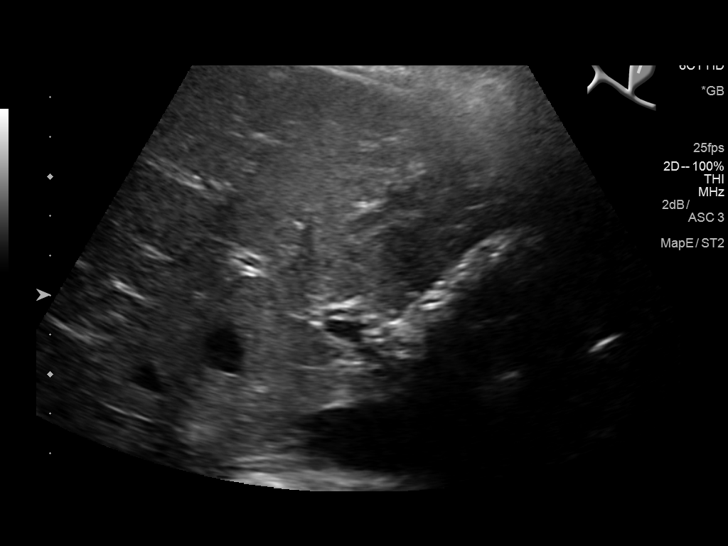
[im 15/40]
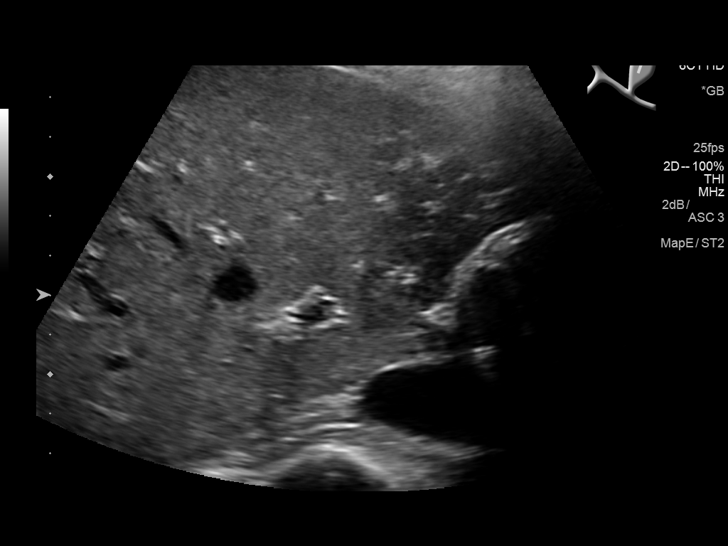
[im 18/40]
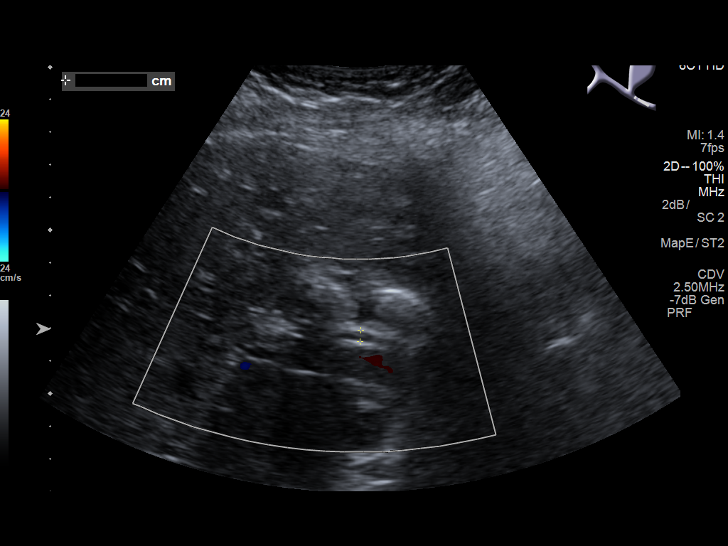
[im 22/40]
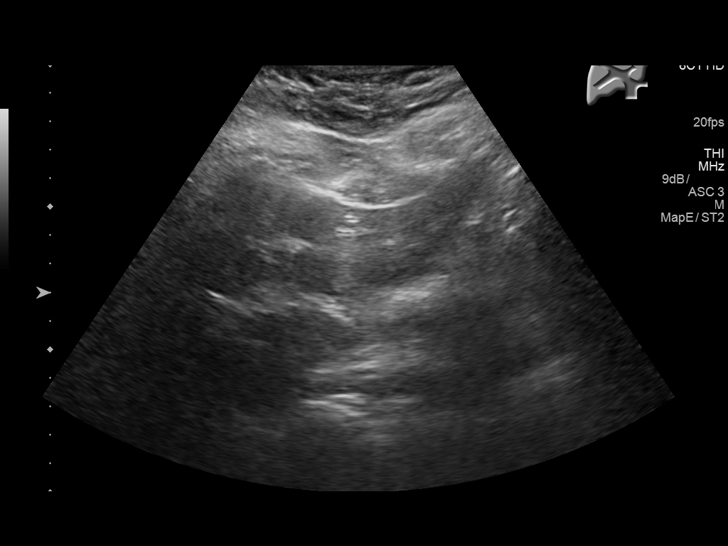
[im 25/40]
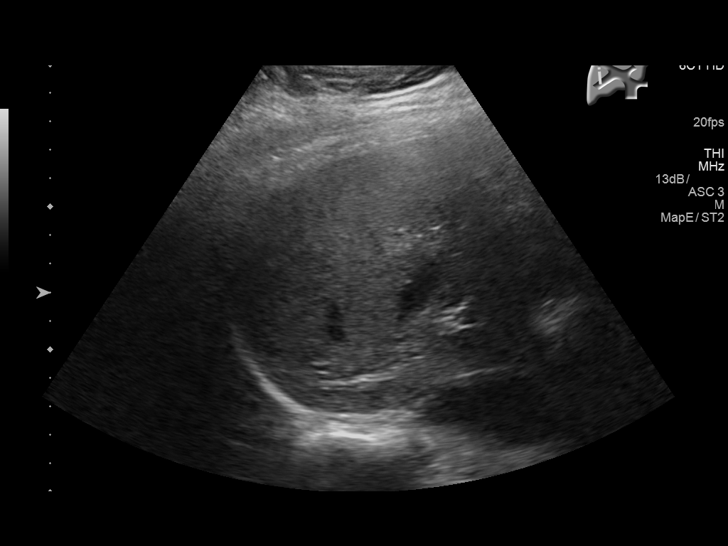
[im 27/40]
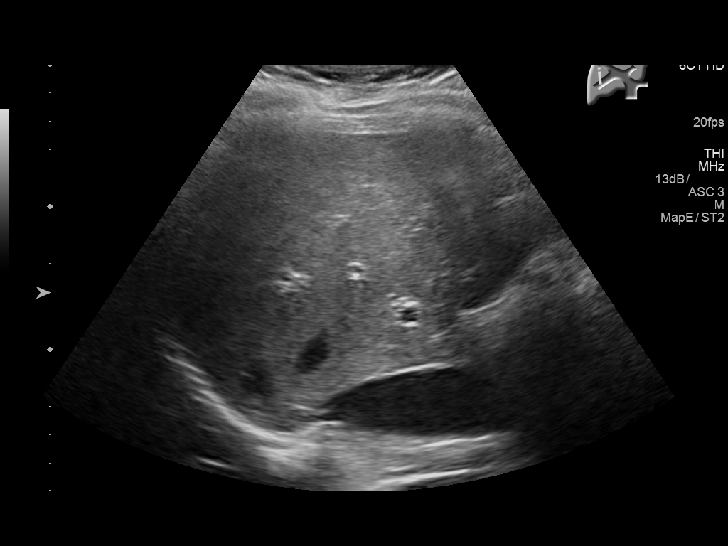
[im 30/40]
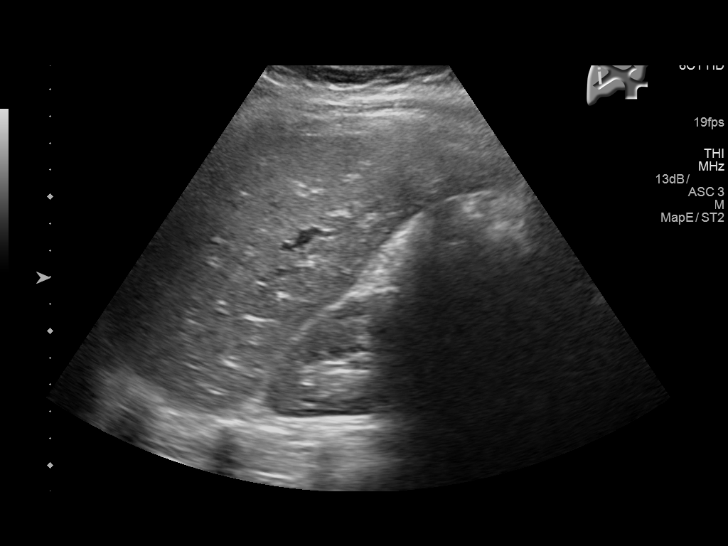
[im 33/40]
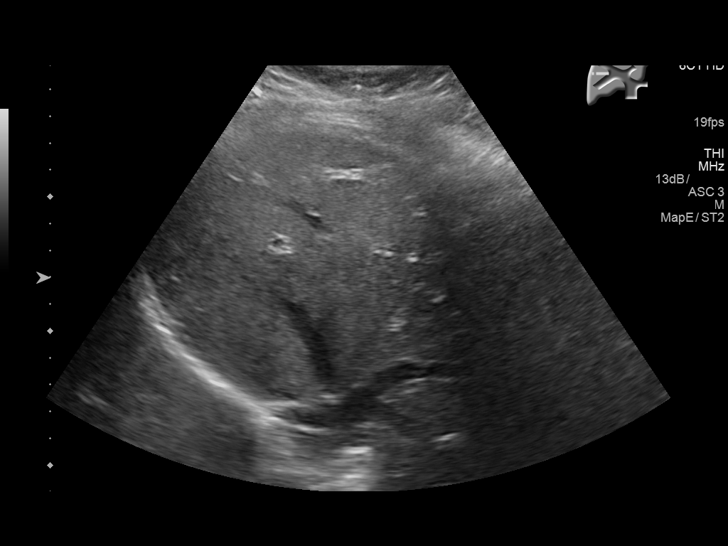
[im 36/40]
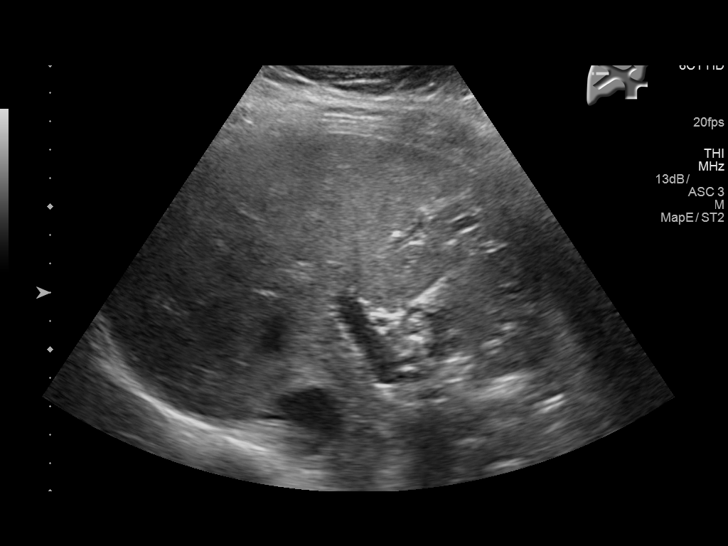
[im 40/40]
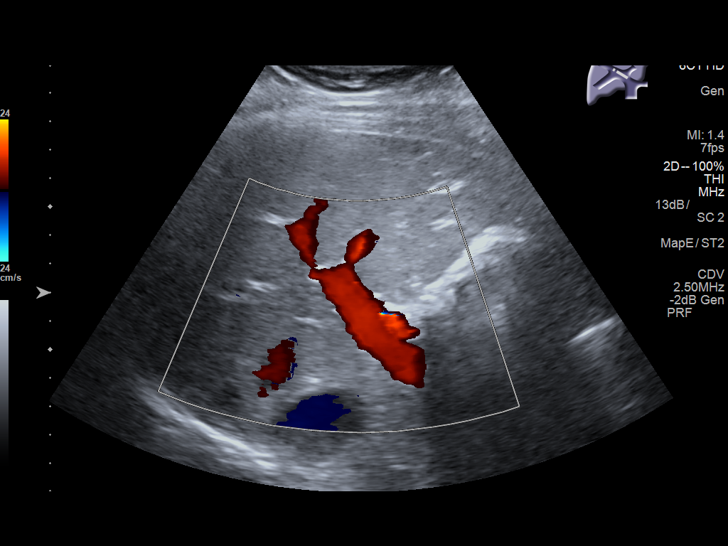

[14 of 25 positions shown; findings below may reference images not displayed]

FINDINGS: Gallbladder:

Contracted gallbladder. Diffuse shadowing at the gallbladder fossa
is presumably due to multiple stones in the gallbladder lumen.
Negative sonographic Morbach. Wall thickness normal at 2.6 mm.

Common bile duct:

Diameter: Normal at 3.5 mm

Liver:

No focal lesion identified. Within normal limits in parenchymal
echogenicity.
IMPRESSION: Contracted stone filled gallbladder without wall thickening or
sonographic Morbach. No biliary dilatation.

## 2018-05-24 ENCOUNTER — Encounter: Payer: Self-pay | Admitting: Family Medicine

## 2018-05-24 ENCOUNTER — Ambulatory Visit: Payer: BLUE CROSS/BLUE SHIELD | Admitting: Family Medicine

## 2018-05-24 VITALS — BP 110/80 | HR 86 | Temp 98.7°F | Resp 12 | Ht 72.84 in | Wt 273.2 lb

## 2018-05-24 DIAGNOSIS — Z01419 Encounter for gynecological examination (general) (routine) without abnormal findings: Secondary | ICD-10-CM | POA: Diagnosis not present

## 2018-05-24 DIAGNOSIS — G4709 Other insomnia: Secondary | ICD-10-CM | POA: Diagnosis not present

## 2018-05-24 DIAGNOSIS — R5383 Other fatigue: Secondary | ICD-10-CM | POA: Diagnosis not present

## 2018-05-24 DIAGNOSIS — Z113 Encounter for screening for infections with a predominantly sexual mode of transmission: Secondary | ICD-10-CM | POA: Diagnosis not present

## 2018-05-24 DIAGNOSIS — E049 Nontoxic goiter, unspecified: Secondary | ICD-10-CM | POA: Diagnosis not present

## 2018-05-24 DIAGNOSIS — F339 Major depressive disorder, recurrent, unspecified: Secondary | ICD-10-CM | POA: Diagnosis not present

## 2018-05-24 DIAGNOSIS — Z131 Encounter for screening for diabetes mellitus: Secondary | ICD-10-CM

## 2018-05-24 DIAGNOSIS — E669 Obesity, unspecified: Secondary | ICD-10-CM | POA: Insufficient documentation

## 2018-05-24 DIAGNOSIS — Z13220 Encounter for screening for lipoid disorders: Secondary | ICD-10-CM

## 2018-05-24 MED ORDER — ESCITALOPRAM OXALATE 10 MG PO TABS: 10.0000 mg | ORAL_TABLET | Freq: Every day | ORAL | 0 refills | Status: DC

## 2018-05-24 NOTE — Progress Notes (Signed)
Name: Kristina Krueger   MRN: [id number]    DOB: 23-May-1996   Date:05/24/2018       Progress Note  Subjective  Chief Complaint  Chief Complaint  Patient presents with  . Establish Care    HPI   Patient presents for annual CPE and follow up  Diet: discussed healthy diet Exercise: needs to increase activity.   Major Depression Recurrent: she has been depressed for the past couple of years, phq 9 is high, states tried medication but did not help so she stopped on her own. Works two jobs and going to school. Lives with her mother. She states that when at home she stays in her room. Still talks to friends but they are not aware that she is depressed. Discussed counseling and she refuses. She states poor appetite but not losing weight, feels tired all the time. Difficulty sleeping. Able to go to work every day, personal hygiene is normal. Denies suicidal thoughts or ideation.   USPSTF grade A and B recommendations  Depression:  Depression screen [facility] 2/9 05/24/2018 05/24/2018  Decreased Interest 3 0  Down, Depressed, Hopeless 0 0  PHQ - 2 Score 3 0  Altered sleeping 3 -  Tired, decreased energy 3 -  Change in appetite 3 -  Feeling bad or failure about yourself  1 -  Trouble concentrating 0 -  Moving slowly or fidgety/restless 0 -  Suicidal thoughts 0 -  PHQ-9 Score 13 -  Difficult doing work/chores Extremely dIfficult -   Hypertension: BP Readings from Last 3 Encounters:  05/24/18 110/80  04/24/17 (!) 107/56  08/25/15 127/84 (91 %, Z = 1.35 /  96 %, Z = 1.77)*   *BP percentiles are based on the August 2017 AAP Clinical Practice Guideline for girls   Obesity: Wt Readings from Last 3 Encounters:  05/24/18 273 lb 3.2 oz (123.9 kg)  04/23/17 259 lb (117.5 kg) (>99 %, Z= 2.54)*  08/25/15 233 lb (105.7 kg) (>99 %, Z= 2.33)*   * Growth percentiles are based on CDC (Girls, 2-20 Years) data.   BMI Readings from Last 3 Encounters:  05/24/18 36.21 kg/m  04/23/17 34.17 kg/m (97 %,  Z= 1.89)*  08/25/15 32.50 kg/m (97 %, Z= 1.88)*   * Growth percentiles are based on CDC (Girls, 2-20 Years) data.   STD testing and prevention (chl/gon/syphilis): check today  Intimate partner violence:negative screen  Sexual History/Pain during Intercourse: sexually active, last intercourse 4 months  Menstrual History/LMP/Abnormal Bleeding: cycles are regular  Incontinence Symptoms: none   Advanced Care Planning: A voluntary discussion about advance care planning including the explanation and discussion of advance directives.  Discussed health care proxy and Living will, and the patient was able to identify a health care proxy as mother    Cervical cancer screening: start at age 36    Lipids:  No results found for: CHOL No results found for: HDL No results found for: LDLCALC No results found for: TRIG No results found for: CHOLHDL No results found for: LDLDIRECT  Glucose:  Glucose, Bld  Date Value Ref Range Status  04/22/2017 84 65 - 99 mg/dL Final  78/29/5621 81 65 - 99 mg/dL Final   Glucose-Capillary  Date Value Ref Range Status  04/23/2017 94 65 - 99 mg/dL Final     Patient Active Problem List   Diagnosis Date Noted  . Obesity (BMI 30-39.9) 05/24/2018  . Major depression, recurrent, chronic (HCC) 05/24/2018  . Other insomnia 05/24/2018  . Chronic tension-type headache,  not intractable 01/29/2017  . Hidradenitis suppurativa 09/27/2015    Past Surgical History:  Procedure Laterality Date  . CHOLECYSTECTOMY N/A 04/23/2017   Procedure: LAPAROSCOPIC CHOLECYSTECTOMY;  Surgeon: Nadeen LandauSmith, Jarvis Wilton, MD;  Location: [facility] ORS;  Service: General;  Laterality: N/A;  . FOOT SURGERY    . TONSILLECTOMY      Family History  Problem Relation Age of Onset  . Hypertension Father   . Hyperthyroidism Maternal Grandmother     Social History   Socioeconomic History  . Marital status: Single    Spouse name: Not on file  . Number of children: 0  . Years of education: Not  on file  . Highest education level: Some college, no degree  Occupational History  . 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 Occupation: Nutritional therapistolder    Employer: [employer]  Social Needs  . Financial resource strain: Not on file  . Food insecurity:    Worry: Never true    Inability: Never true  . Transportation needs:    Medical: No    Non-medical: No  Tobacco Use  . Smoking status: Never Smoker  . Smokeless tobacco: Never Used  Substance and Sexual Activity  . Alcohol use: No  . Drug use: No  . Sexual activity: Not Currently    Birth control/protection: Condom  Lifestyle  . Physical activity:    Days per week: 1 day    Minutes per session: 30 min  . Stress: Rather much  Relationships  . Social connections:    Talks on phone: More than three times a week    Gets together: Once a week    Attends religious service: Never    Active member of club or organization: No    Attends meetings of clubs or organizations: Never    Relationship status: Never married  . Intimate partner violence:    Fear of current or ex partner: No    Emotionally abused: No    Physically abused: No    Forced sexual activity: No  Other Topics Concern  . Not on file  Social History Narrative   Lives with her mother, going to school and working two jobs.      Current Outpatient Medications:  .  escitalopram (LEXAPRO) 10 MG tablet, Take 1 tablet (10 mg total) by mouth daily., Disp: 30 tablet, Rfl: 0  No Known Allergies   ROS   Constitutional: Negative for fever or weight change.  Respiratory: Negative for cough and shortness of breath.   Cardiovascular: Negative for chest pain or palpitations.  Gastrointestinal: Negative for abdominal pain, no bowel changes.  Musculoskeletal: Negative for gait problem or joint swelling.  Skin: Negative for rash.  Neurological: Negative for dizziness or headache.  No other specific complaints in a complete review of systems (except as listed in  HPI above).   Objective  Vitals:   05/24/18 1012  BP: 110/80  Pulse: 86  Resp: 12  Temp: 98.7 F (37.1 C)  TempSrc: Oral  SpO2: 98%  Weight: 273 lb 3.2 oz (123.9 kg)  Height: 6' 0.84" (1.85 m)    Body mass index is 36.21 kg/m.  Physical Exam  Constitutional: Patient appears well-developed and obese No distress.  HENT: Head: Normocephalic and atraumatic. Ears: B TMs ok, no erythema or effusion; Nose: Nose normal. Mouth/Throat: Oropharynx is clear and moist. No oropharyngeal exudate.  Eyes: Conjunctivae and EOM are normal. Pupils are equal, round, and reactive to light. No scleral icterus.  Neck:  Normal range of motion. Neck supple. No JVD present. Positive  thyromegaly present.  Cardiovascular: Normal rate, regular rhythm and normal heart sounds.  No murmur heard. No BLE edema. Pulmonary/Chest: Effort normal and breath sounds normal. No respiratory distress. Abdominal: Soft. Bowel sounds are normal, no distension. There is no tenderness. no masses Breast: no lumps or masses, no nipple discharge or rashes FEMALE GENITALIA:  Not done RECTAL: not done Musculoskeletal: Normal range of motion, no joint effusions. No gross deformities Neurological: he is alert and oriented to person, place, and time. No cranial nerve deficit. Coordination, balance, strength, speech and gait are normal.  Skin: Skin is warm and dry. No rash noted. No erythema. Acanthosis nigricans neck  Psychiatric: Patient has a normal mood and affect. behavior is normal. Judgment and thought content normal.    PHQ2/9: Depression screen [facility] 2/9 05/24/2018 05/24/2018  Decreased Interest 3 0  Down, Depressed, Hopeless 0 0  PHQ - 2 Score 3 0  Altered sleeping 3 -  Tired, decreased energy 3 -  Change in appetite 3 -  Feeling bad or failure about yourself  1 -  Trouble concentrating 0 -  Moving slowly or fidgety/restless 0 -  Suicidal thoughts 0 -  PHQ-9 Score 13 -  Difficult doing work/chores Extremely  dIfficult -     Fall Risk: Fall Risk  05/24/2018  Falls in the past year? Yes  Number falls in past yr: 1  Injury with Fall? No  Follow up Falls prevention discussed     Functional Status Survey: Is the patient deaf or have difficulty hearing?: No Does the patient have difficulty seeing, even when wearing glasses/contacts?: No Does the patient have difficulty concentrating, remembering, or making decisions?: No Does the patient have difficulty walking or climbing stairs?: No Does the patient have difficulty dressing or bathing?: No Does the patient have difficulty doing errands alone such as visiting a doctor's office or shopping?: No   Assessment & Plan  1. Well woman exam  Discussed importance of 150 minutes of physical activity weekly, eat two servings of fish weekly, eat one serving of tree nuts ( cashews, pistachios, pecans, almonds.Marland Kitchen) every other day, eat 6 servings of fruit/vegetables daily and drink plenty of water and avoid sweet beverages.   2. Screen for STD (sexually transmitted disease)  - C. trachomatis/N. gonorrhoeae RNA - HIV antibody - RPR - Vitamin B12  3. Obesity (BMI 30-39.9)  Discussed with the patient the risk posed by an increased BMI. Discussed importance of portion control, calorie counting and at least 150 minutes of physical activity weekly. Avoid sweet beverages and drink more water. Eat at least 6 servings of fruit and vegetables daily   4. Major depression, recurrent, chronic (HCC)  - CBC with Differential/Platelet - COMPLETE METABOLIC PANEL WITH GFR  Start lexapro, discussed possible side effects, return in one month for follow up, discussed increase thought of suicide and need to call 911 , also to discuss with mother that she started medication  5. Other insomnia  We will treat depression first, discussed mindfulness   6. Other fatigue  - VITAMIN D 25 Hydroxy (Vit-D Deficiency, Fractures) - TSH  7. Lipid screening  - Lipid  panel  8. Diabetes mellitus screening  - Hemoglobin A1c  9. Enlarged thyroid  - US THYROID; Future

## 2018-05-24 NOTE — Patient Instructions (Signed)
Preventive Care 18-39 Years, Female Preventive care refers to lifestyle choices and visits with your health care provider that can promote health and wellness. What does preventive care include?  A yearly physical exam. This is also called an annual well check.  Dental exams once or twice a year.  Routine eye exams. Ask your health care provider how often you should have your eyes checked.  Personal lifestyle choices, including: ? Daily care of your teeth and gums. ? Regular physical activity. ? Eating a healthy diet. ? Avoiding tobacco and drug use. ? Limiting alcohol use. ? Practicing safe sex. ? Taking vitamin and mineral supplements as recommended by your health care provider. What happens during an annual well check? The services and screenings done by your health care provider during your annual well check will depend on your age, overall health, lifestyle risk factors, and family history of disease. Counseling Your health care provider may ask you questions about your:  Alcohol use.  Tobacco use.  Drug use.  Emotional well-being.  Home and relationship well-being.  Sexual activity.  Eating habits.  Work and work Statistician.  Method of birth control.  Menstrual cycle.  Pregnancy history.  Screening You may have the following tests or measurements:  Height, weight, and BMI.  Diabetes screening. This is done by checking your blood sugar (glucose) after you have not eaten for a while (fasting).  Blood pressure.  Lipid and cholesterol levels. These may be checked every 5 years starting at age 66.  Skin check.  Hepatitis C blood test.  Hepatitis B blood test.  Sexually transmitted disease (STD) testing.  BRCA-related cancer screening. This may be done if you have a family history of breast, ovarian, tubal, or peritoneal cancers.  Pelvic exam and Pap test. This may be done every 3 years starting at age 40. Starting at age 59, this may be done every 5  years if you have a Pap test in combination with an HPV test.  Discuss your test results, treatment options, and if necessary, the need for more tests with your health care provider. Vaccines Your health care provider may recommend certain vaccines, such as:  Influenza vaccine. This is recommended every year.  Tetanus, diphtheria, and acellular pertussis (Tdap, Td) vaccine. You may need a Td booster every 10 years.  Varicella vaccine. You may need this if you have not been vaccinated.  HPV vaccine. If you are 20 or younger, you may need three doses over 6 months.  Measles, mumps, and rubella (MMR) vaccine. You may need at least one dose of MMR. You may also need a second dose.  Pneumococcal 13-valent conjugate (PCV13) vaccine. You may need this if you have certain conditions and were not previously vaccinated.  Pneumococcal polysaccharide (PPSV23) vaccine. You may need one or two doses if you smoke cigarettes or if you have certain conditions.  Meningococcal vaccine. One dose is recommended if you are age 20-21 years and a first-year college student living in a residence hall, or if you have one of several medical conditions. You may also need additional booster doses.  Hepatitis A vaccine. You may need this if you have certain conditions or if you travel or work in places where you may be exposed to hepatitis A.  Hepatitis B vaccine. You may need this if you have certain conditions or if you travel or work in places where you may be exposed to hepatitis B.  Haemophilus influenzae type b (Hib) vaccine. You may need this if  you have certain risk factors.  Talk to your health care provider about which screenings and vaccines you need and how often you need them. This information is not intended to replace advice given to you by your health care provider. Make sure you discuss any questions you have with your health care provider. Document Released: 01/12/2002 Document Revised: 08/05/2016  Document Reviewed: 09/17/2015 Elsevier Interactive Patient Education  [employer].

## 2018-05-24 NOTE — Addendum Note (Signed)
Addended by: Tommie RaymondBOOKER, Jaylenn Altier L on: 05/24/2018 11:15 AM   Modules accepted: Orders

## 2018-05-25 LAB — LIPID PANEL
Cholesterol: 130 mg/dL (ref <200)
HDL: 54 mg/dL (ref >50)
LDL CHOLESTEROL (CALC): 61 mg/dL
Non-HDL Cholesterol (Calc): 76 mg/dL (calc) (ref <130)
Total CHOL/HDL Ratio: 2.4 (calc) (ref <5.0)
Triglycerides: 69 mg/dL (ref <150)

## 2018-05-25 LAB — COMPLETE METABOLIC PANEL WITH GFR
AG Ratio: 1.4 (calc) (ref 1.0–2.5)
ALBUMIN MSPROF: 4.6 g/dL (ref 3.6–5.1)
ALT: 18 U/L (ref 6–29)
AST: 17 U/L (ref 10–30)
Alkaline phosphatase (APISO): 69 U/L (ref 33–115)
BUN: 10 mg/dL (ref 7–25)
CALCIUM: 9.8 mg/dL (ref 8.6–10.2)
CO2: 25 mmol/L (ref 20–32)
CREATININE: 0.93 mg/dL (ref 0.50–1.10)
Chloride: 105 mmol/L (ref 98–110)
GFR, EST NON AFRICAN AMERICAN: 88 mL/min/{1.73_m2} (ref >=60)
GFR, Est African American: 103 mL/min/{1.73_m2} (ref >=60)
GLOBULIN: 3.2 g/dL (ref 1.9–3.7)
Glucose, Bld: 80 mg/dL (ref 65–99)
Potassium: 4.1 mmol/L (ref 3.5–5.3)
SODIUM: 137 mmol/L (ref 135–146)
Total Bilirubin: 0.3 mg/dL (ref 0.2–1.2)
Total Protein: 7.8 g/dL (ref 6.1–8.1)

## 2018-05-25 LAB — CBC WITH DIFFERENTIAL/PLATELET
BASOS ABS: 28 {cells}/uL (ref 0–200)
Basophils Relative: 0.4 %
EOS PCT: 2.2 %
Eosinophils Absolute: 152 cells/uL (ref 15–500)
HEMATOCRIT: 37 % (ref 35.0–45.0)
Hemoglobin: 12.5 g/dL (ref 11.7–15.5)
LYMPHS ABS: 2360 {cells}/uL (ref 850–3900)
MCH: 28.2 pg (ref 27.0–33.0)
MCHC: 33.8 g/dL (ref 32.0–36.0)
MCV: 83.5 fL (ref 80.0–100.0)
MPV: 10.2 fL (ref 7.5–12.5)
Monocytes Relative: 6.5 %
Neutro Abs: 3912 cells/uL (ref 1500–7800)
Neutrophils Relative %: 56.7 %
Platelets: 377 10*3/uL (ref 140–400)
RBC: 4.43 10*6/uL (ref 3.80–5.10)
RDW: 14.2 % (ref 11.0–15.0)
Total Lymphocyte: 34.2 %
WBC mixed population: 449 cells/uL (ref 200–950)
WBC: 6.9 10*3/uL (ref 3.8–10.8)

## 2018-05-25 LAB — TSH: TSH: 2.61 mIU/L

## 2018-05-25 LAB — VITAMIN D 25 HYDROXY (VIT D DEFICIENCY, FRACTURES): Vit D, 25-Hydroxy: 14 ng/mL — ABNORMAL LOW (ref 30–100)

## 2018-05-25 LAB — RPR: RPR: NONREACTIVE

## 2018-05-25 LAB — VITAMIN B12: Vitamin B-12: 440 pg/mL (ref 200–1100)

## 2018-05-25 LAB — HEMOGLOBIN A1C
HEMOGLOBIN A1C: 5.2 %{Hb} (ref <5.7)
MEAN PLASMA GLUCOSE: 103 (calc)
eAG (mmol/L): 5.7 (calc)

## 2018-05-25 LAB — HIV ANTIBODY (ROUTINE TESTING W REFLEX): HIV 1&2 Ab, 4th Generation: NONREACTIVE

## 2018-05-26 NOTE — Addendum Note (Signed)
Addended by: Phineas SemenJOHNSON, Lendon George L on: 05/26/2018 11:01 AM   Modules accepted: Orders

## 2018-06-20 ENCOUNTER — Other Ambulatory Visit: Payer: Self-pay | Admitting: Family Medicine

## 2018-06-20 DIAGNOSIS — F339 Major depressive disorder, recurrent, unspecified: Secondary | ICD-10-CM

## 2018-06-20 NOTE — Telephone Encounter (Signed)
Refill request was sent to Dr. Krichna Sowles for approval and submission.  

## 2018-06-21 NOTE — Telephone Encounter (Signed)
Sent refill to pharmacy, but she needs follow up

## 2018-06-21 NOTE — Telephone Encounter (Signed)
Pt has appt with Lanora ManisElizabeth for the 25th

## 2018-06-23 ENCOUNTER — Other Ambulatory Visit (HOSPITAL_COMMUNITY)
Admission: RE | Admit: 2018-06-23 | Discharge: 2018-06-23 | Disposition: A | Discharge status: Home / Self Care | Payer: BLUE CROSS/BLUE SHIELD | Source: Ambulatory Visit | Attending: Nurse Practitioner | Admitting: Nurse Practitioner

## 2018-06-23 ENCOUNTER — Ambulatory Visit: Payer: BLUE CROSS/BLUE SHIELD | Admitting: Nurse Practitioner

## 2018-06-23 ENCOUNTER — Encounter: Payer: Self-pay | Admitting: Nurse Practitioner

## 2018-06-23 VITALS — BP 120/70 | HR 82 | Temp 98.3°F | Resp 12 | Ht 72.84 in | Wt 277.5 lb

## 2018-06-23 DIAGNOSIS — R1032 Left lower quadrant pain: Secondary | ICD-10-CM

## 2018-06-23 DIAGNOSIS — R110 Nausea: Secondary | ICD-10-CM

## 2018-06-23 DIAGNOSIS — E669 Obesity, unspecified: Secondary | ICD-10-CM | POA: Diagnosis not present

## 2018-06-23 DIAGNOSIS — F339 Major depressive disorder, recurrent, unspecified: Secondary | ICD-10-CM | POA: Insufficient documentation

## 2018-06-23 DIAGNOSIS — M25531 Pain in right wrist: Secondary | ICD-10-CM

## 2018-06-23 DIAGNOSIS — Z68.41 Body mass index (BMI) pediatric, greater than or equal to 95th percentile for age: Secondary | ICD-10-CM | POA: Diagnosis not present

## 2018-06-23 DIAGNOSIS — G4700 Insomnia, unspecified: Secondary | ICD-10-CM | POA: Insufficient documentation

## 2018-06-23 LAB — POCT URINALYSIS DIPSTICK
Appearance: NORMAL
Bilirubin, UA: NEGATIVE
Glucose, UA: NEGATIVE
Ketones, UA: NEGATIVE
LEUKOCYTES UA: NEGATIVE
Nitrite, UA: NEGATIVE
PROTEIN UA: NEGATIVE
Spec Grav, UA: 1.01 (ref 1.010–1.025)
Urobilinogen, UA: 0.2 E.U./dL
pH, UA: 6 (ref 5.0–8.0)

## 2018-06-23 LAB — CBC WITH DIFFERENTIAL/PLATELET
BASOS ABS: 16 {cells}/uL (ref 0–200)
Basophils Relative: 0.2 %
EOS ABS: 180 {cells}/uL (ref 15–500)
EOS PCT: 2.2 %
HEMATOCRIT: 34 % — AB (ref 35.0–45.0)
Hemoglobin: 11.4 g/dL — ABNORMAL LOW (ref 11.7–15.5)
Lymphs Abs: 1804 cells/uL (ref 850–3900)
MCH: 27.9 pg (ref 27.0–33.0)
MCHC: 33.5 g/dL (ref 32.0–36.0)
MCV: 83.3 fL (ref 80.0–100.0)
MPV: 10.2 fL (ref 7.5–12.5)
Monocytes Relative: 6 %
NEUTROS ABS: 5707 {cells}/uL (ref 1500–7800)
NEUTROS PCT: 69.6 %
Platelets: 331 10*3/uL (ref 140–400)
RBC: 4.08 10*6/uL (ref 3.80–5.10)
RDW: 14.3 % (ref 11.0–15.0)
Total Lymphocyte: 22 %
WBC mixed population: 492 cells/uL (ref 200–950)
WBC: 8.2 10*3/uL (ref 3.8–10.8)

## 2018-06-23 LAB — COMPLETE METABOLIC PANEL WITH GFR
AG RATIO: 1.7 (calc) (ref 1.0–2.5)
ALKALINE PHOSPHATASE (APISO): 61 U/L (ref 33–115)
ALT: 18 U/L (ref 6–29)
AST: 16 U/L (ref 10–30)
Albumin: 4.6 g/dL (ref 3.6–5.1)
BILIRUBIN TOTAL: 0.4 mg/dL (ref 0.2–1.2)
BUN: 8 mg/dL (ref 7–25)
CHLORIDE: 103 mmol/L (ref 98–110)
CO2: 27 mmol/L (ref 20–32)
Calcium: 9.6 mg/dL (ref 8.6–10.2)
Creat: 0.72 mg/dL (ref 0.50–1.10)
GFR, Est African American: 140 mL/min/{1.73_m2} (ref >=60)
GFR, Est Non African American: 121 mL/min/{1.73_m2} (ref >=60)
GLOBULIN: 2.7 g/dL (ref 1.9–3.7)
Glucose, Bld: 81 mg/dL (ref 65–139)
POTASSIUM: 3.6 mmol/L (ref 3.5–5.3)
SODIUM: 137 mmol/L (ref 135–146)
Total Protein: 7.3 g/dL (ref 6.1–8.1)

## 2018-06-23 LAB — POCT URINE PREGNANCY: Preg Test, Ur: NEGATIVE

## 2018-06-23 LAB — LIPASE: LIPASE: 36 U/L (ref 7–60)

## 2018-06-23 MED ORDER — ESCITALOPRAM OXALATE 10 MG PO TABS: 10.0000 mg | ORAL_TABLET | Freq: Every day | ORAL | 0 refills | Status: DC

## 2018-06-23 NOTE — Patient Instructions (Addendum)
- Recommend continuing on the lexapro for at least 3-6 months, also recommend counseling to further assist  - Recommend thumb spica wrist splint to help decrease wrist pain along with ice and ibuprofen as you have been doing. Do not exceed more than 2,400mg  a day and take this medicine with food.  - Will draw blood work and check urine for potential causes of abdominal pain. Recommend OTC probiotic daily, taking 1-2 calcium carbonate (tums) 1 hour before eating or ranitidine OTC first thing in the morning, and a bland diet for the next 2 weeks.    Food Choices to Help Relieve Diarrhea, Adult When you have diarrhea, the foods you eat and your eating habits are very important. Choosing the right foods and drinks can help:  Relieve diarrhea.  Replace lost fluids and nutrients.  Prevent dehydration.  What general guidelines should I follow? Relieving diarrhea  Choose foods with less than 2 g or .07 oz. of fiber per serving.  Limit fats to less than 8 tsp (38 g or 1.34 oz.) a day.  Avoid the following: ? Foods and beverages sweetened with high-fructose corn syrup, honey, or sugar alcohols such as xylitol, sorbitol, and mannitol. ? Foods that contain a lot of fat or sugar. ? Fried, greasy, or spicy foods. ? High-fiber grains, breads, and cereals. ? Raw fruits and vegetables.  Eat foods that are rich in probiotics. These foods include dairy products such as yogurt and fermented milk products. They help increase healthy bacteria in the stomach and intestines (gastrointestinal tract, or GI tract).  If you have lactose intolerance, avoid dairy products. These may make your diarrhea worse.  Take medicine to help stop diarrhea (antidiarrheal medicine) only as told by your health care provider. Replacing nutrients  Eat small meals or snacks every 3-4 hours.  Eat bland foods, such as white rice, toast, or baked potato, until your diarrhea starts to get better. Gradually reintroduce  nutrient-rich foods as tolerated or as told by your health care provider. This includes: ? Well-cooked protein foods. ? Peeled, seeded, and soft-cooked fruits and vegetables. ? Low-fat dairy products.  Take vitamin and mineral supplements as told by your health care provider. Preventing dehydration   Start by sipping water or a special solution to prevent dehydration (oral rehydration solution, ORS). Urine that is clear or pale yellow means that you are getting enough fluid.  Try to drink at least 8-10 cups of fluid each day to help replace lost fluids.  You may add other liquids in addition to water, such as clear juice or decaffeinated sports drinks, as tolerated or as told by your health care provider.  Avoid drinks with caffeine, such as coffee, tea, or soft drinks.  Avoid alcohol. What foods are recommended? The items listed may not be a complete list. Talk with your health care provider about what dietary choices are best for you. Grains White rice. White, [place], or pita breads (fresh or toasted), including plain rolls, buns, or bagels. White pasta. Saltine, soda, or graham crackers. Pretzels. Low-fiber cereal. Cooked cereals made with water (such as cornmeal, farina, or cream cereals). Plain muffins. Matzo. Melba toast. Zwieback. Vegetables Potatoes (without the skin). Most well-cooked and canned vegetables without skins or seeds. Tender lettuce. Fruits Apple sauce. Fruits canned in juice. Cooked apricots, cherries, grapefruit, peaches, pears, or plums. Fresh bananas and cantaloupe. Meats and other protein foods Baked or boiled chicken. Eggs. Tofu. Fish. Seafood. Smooth nut butters. Ground or well-cooked tender beef, ham, veal, lamb, pork,  or poultry. Dairy Plain yogurt, kefir, and unsweetened liquid yogurt. Lactose-free milk, buttermilk, skim milk, or soy milk. Low-fat or nonfat hard cheese. Beverages Water. Low-calorie sports drinks. Fruit juices without pulp. Strained tomato  and vegetable juices. Decaffeinated teas. Sugar-free beverages not sweetened with sugar alcohols. Oral rehydration solutions, if approved by your health care provider. Seasoning and other foods Bouillon, broth, or soups made from recommended foods. What foods are not recommended? The items listed may not be a complete list. Talk with your health care provider about what dietary choices are best for you. Grains Whole grain, whole wheat, bran, or rye breads, rolls, pastas, and crackers. Wild or brown rice. Whole grain or bran cereals. Barley. Oats and oatmeal. Corn tortillas or taco shells. Granola. Popcorn. Vegetables Raw vegetables. Fried vegetables. Cabbage, broccoli, Brussels sprouts, artichokes, baked beans, beet greens, corn, kale, legumes, peas, sweet potatoes, and yams. Potato skins. Cooked spinach and cabbage. Fruits Dried fruit, including raisins and dates. Raw fruits. Stewed or dried prunes. Canned fruits with syrup. Meat and other protein foods Fried or fatty meats. Deli meats. Chunky nut butters. Nuts and seeds. Beans and lentils. Tomasa Blase. Hot dogs. Sausage. Dairy High-fat cheeses. Whole milk, chocolate milk, and beverages made with milk, such as milk shakes. Half-and-half. Cream. sour cream. Ice cream. Beverages Caffeinated beverages (such as coffee, tea, soda, or energy drinks). Alcoholic beverages. Fruit juices with pulp. Prune juice. Soft drinks sweetened with high-fructose corn syrup or sugar alcohols. High-calorie sports drinks. Fats and oils Butter. Cream sauces. Margarine. Salad oils. Plain salad dressings. Olives. Avocados. Mayonnaise. Sweets and desserts Sweet rolls, doughnuts, and sweet breads. Sugar-free desserts sweetened with sugar alcohols such as xylitol and sorbitol. Seasoning and other foods Honey. Hot sauce. Chili powder. Gravy. Cream-based or milk-based soups. Pancakes and waffles. Summary  When you have diarrhea, the foods you eat and your eating habits are very  important.  Make sure you get at least 8-10 cups of fluid each day, or enough to keep your urine clear or pale yellow.  Eat bland foods and gradually reintroduce healthy, nutrient-rich foods as tolerated, or as told by your health care provider.  Avoid high-fiber, fried, greasy, or spicy foods. This information is not intended to replace advice given to you by your health care provider. Make sure you discuss any questions you have with your health care provider. Document Released: 02/06/2004 Document Revised: 11/13/2016 Document Reviewed: 11/13/2016 Elsevier Interactive Patient Education  2018 Elsevier Inc.    Lollie Sails Tenosynovitis Tendons attach muscles to bones. They also help with joint movements. When tendons become irritated or swollen, it is called tendinitis. The extensor pollicis brevis (EPB) tendon connects the EPB muscle to a bone that is near the base of the thumb. The EPB muscle helps to straighten and extend the thumb. [place] tenosynovitis is a condition in which the EPB tendon lining (sheath) becomes irritated, thickened, and swollen. This condition is sometimes called stenosing tenosynovitis. This condition causes pain on the thumb side of the back of the wrist. What are the causes? Causes of this condition include:  Activities that repeatedly cause your thumb and wrist to extend.  A sudden increase in activity or change in activity that affects your wrist.  What increases the risk? This condition is more likely to develop in:  Females.  People who have diabetes.  Women who have recently given birth.  People who are over 17 years of age.  People who do activities that involve repeated hand and wrist motions, such  as tennis, racquetball, volleyball, gardening, and taking care of children.  People who do heavy labor.  People who have poor wrist strength and flexibility.  People who do not warm up properly before activities.  What are the signs or  symptoms? Symptoms of this condition include:  Pain or tenderness over the thumb side of the back of the wrist when your thumb and wrist are not moving.  Pain that gets worse when you straighten your thumb or extend your thumb or wrist.  Pain when the injured area is touched.  Locking or catching of the thumb joint while you bend and straighten your thumb.  Decreased thumb motion due to pain.  Swelling over the affected area.  How is this diagnosed? This condition is diagnosed with a medical history and physical exam. Your health care provider will ask for details about your injury and ask about your symptoms. How is this treated? Treatment may include the use of icing and medicines to reduce pain and swelling. You may also be advised to wear a splint or brace to limit your thumb and wrist motion. In less severe cases, treatment may also include working with a physical therapist to strengthen your wrist and calm the irritation around your EPB tendon sheath. In severe cases, surgery may be needed. Follow these instructions at home: If you have a splint or brace:  Wear it as told by your health care provider. Remove it only as told by your health care provider.  Loosen the splint or brace if your fingers become numb and tingle, or if they turn cold and blue.  Keep the splint or brace clean and dry. Managing pain, stiffness, and swelling  If directed, apply ice to the injured area. ? Put ice in a plastic bag. ? Place a towel between your skin and the bag. ? Leave the ice on for 20 minutes, 2-3 times per day.  Move your fingers often to avoid stiffness and to lessen swelling.  Raise (elevate) the injured area above the level of your heart while you are sitting or lying down. General instructions  Return to your normal activities as told by your health care provider. Ask your health care provider what activities are safe for you.  Take over-the-counter and prescription medicines  only as told by your health care provider.  Keep all follow-up visits as told by your health care provider. This is important.  Do not drive or operate heavy machinery while taking prescription pain medicine. Contact a health care provider if:  Your pain, tenderness, or swelling gets worse, even if you have had treatment.  You have numbness or tingling in your wrist, hand, or fingers on the injured side. This information is not intended to replace advice given to you by your health care provider. Make sure you discuss any questions you have with your health care provider. Document Released: 11/16/2005 Document Revised: 04/23/2016 Document Reviewed: 01/22/2015 Elsevier Interactive Patient Education  [employer]2018 Elsevier Inc.

## 2018-06-23 NOTE — Progress Notes (Addendum)
Name: Kristina Krueger   MRN: [id number]    DOB: 03-01-1996   Date:06/23/2018       Progress Note  Subjective  Chief Complaint  Chief Complaint  Patient presents with  . Depression    She took her last Lexapro yesterday. She does not want to continue taking this medication. She reports that she feels good.  . Wrist Pain    Left wrist pain x 2 weeks. Wrist has stiffness. Ice compression and compression and Ibuprofen for treatment.  . Abdominal Pain    x 6 days has burning in her stomach and throat feels nauseous, unable to eat and has had some diarrhea.    HPI  Depression Patient is on lexapro 10mg  was started on this by PCP on 6/25. States has a lot of stress from work- 2 jobs as LawyerCNA and working at [employer]. Patient is going to school at [employer]- studying nursing and plans to starting the program in the fall. Just got off a cruise which helped her relax. States has been on medication for depression before but doesn't recall.    Depression screen PHQ 2/9 06/23/2018  Decreased Interest 1  Down, Depressed, Hopeless 1  PHQ - 2 Score 2  Altered sleeping 2  Tired, decreased energy 1  Change in appetite 2  Feeling bad or failure about yourself  0  Trouble concentrating 0  Moving slowly or fidgety/restless 0  Suicidal thoughts 0  PHQ-9 Score 7  Difficult doing work/chores Somewhat difficult    Wrist Pain Right  wrist pain x 2 weeks- constant. States it feels like it's from work because of the repetitive nature of folding. Wrist has stiffness. Ice  and compression and Ibuprofen for treatment. States ibuprofen help take the pain away temporarily. Denies weakness, numbness or tingling, no known injuries.   Abdominal Pain  States abdominal pain started on Saturday central mid abdominal pain that radiated to chest and had burning feeling in her chest. Noticed 1 hour after meals. Then had loose stools bristol scale 6 pain would resolve after BM. Every time she has eaten since has had  similar pains and then diarrhea followed by pain relief. Denies nausea and vomiting, no blood in stools. Had cholecystectomy in 2018   Patient Active Problem List   Diagnosis Date Noted  . Obesity (BMI 30-39.9) 05/24/2018  . Major depression, recurrent, chronic (HCC) 05/24/2018  . Other insomnia 05/24/2018  . Chronic tension-type headache, not intractable 01/29/2017  . Hidradenitis suppurativa 09/27/2015    Past Medical History:  Diagnosis Date  . Cholelithiases   . Hidradenitis suppurativa     Past Surgical History:  Procedure Laterality Date  . CHOLECYSTECTOMY N/A 04/23/2017   Procedure: LAPAROSCOPIC CHOLECYSTECTOMY;  Surgeon: Nadeen LandauSmith, Jarvis Wilton, MD;  Location: [facility] ORS;  Service: General;  Laterality: N/A;  . FOOT SURGERY    . TONSILLECTOMY      Social History   Tobacco Use  . Smoking status: Never Smoker  . Smokeless tobacco: Never Used  Substance Use Topics  . Alcohol use: No     Current Outpatient Medications:  .  escitalopram (LEXAPRO) 10 MG tablet, TAKE 1 TABLET(10 MG) BY MOUTH DAILY (Patient not taking: Reported on 06/23/2018), Disp: 30 tablet, Rfl: 0  No Known Allergies  Review of Systems  Constitutional: Positive for malaise/fatigue. Negative for chills and fever.  HENT: Negative for sinus pain and sore throat.   Respiratory: Negative for cough and shortness of breath.   Cardiovascular: Positive for chest  pain (chest burning after meals). Negative for palpitations.  Gastrointestinal: Positive for abdominal pain and diarrhea. Negative for blood in stool, constipation, nausea and vomiting.  Genitourinary: Positive for frequency. Negative for dysuria, hematuria and urgency.  Musculoskeletal: Negative for back pain and myalgias.  Neurological: Negative for dizziness, weakness and headaches.  Psychiatric/Behavioral: Negative for depression and suicidal ideas.    No other specific complaints in a complete review of systems (except as listed in HPI  above).  Objective  Vitals:   06/23/18 0859  BP: 120/70  Pulse: 82  Resp: 12  Temp: 98.3 F (36.8 C)  TempSrc: Oral  SpO2: 100%  Weight: 277 lb 8 oz (125.9 kg)  Height: 6' 0.84" (1.85 m)    Body mass index is 36.78 kg/m.  Nursing Note and Vital Signs reviewed.  Physical Exam  Constitutional: She appears well-developed and well-nourished. She is cooperative.  HENT:  Head: Normocephalic.  Right Ear: Hearing normal.  Left Ear: Hearing normal.  Nose: Nose normal.  Mouth/Throat: Oropharynx is clear and moist and mucous membranes are normal.  Eyes: Pupils are equal, round, and reactive to light. EOM are normal.  Cardiovascular: Normal rate, regular rhythm and normal heart sounds.  No lower extremity edema noted.  Pulmonary/Chest: Effort normal and breath sounds normal.  Abdominal: Soft. Normal appearance and bowel sounds are normal. There is no hepatosplenomegaly. There is tenderness in the periumbilical area, suprapubic area and left lower quadrant. There is no rigidity, no rebound, no guarding, no CVA tenderness, no tenderness at McBurney's point and negative Murphy's sign. No hernia.  Musculoskeletal:       Right wrist: She exhibits decreased range of motion and tenderness. She exhibits no bony tenderness, no swelling, no effusion, no crepitus, no deformity and no laceration.       Arms: Neurological: She is alert.  Skin: Skin is warm and dry.  Psychiatric: She has a normal mood and affect. Her speech is normal and behavior is normal. Thought content normal.     No results found for this or any previous visit (from the past 48 hour(s)).  Assessment & Plan  1. Left lower quadrant pain - discussed bland diet, taking OTC probiotic and ranitidine daily and tums before meals. Will re-evaluate in 10 days or sooner if needed. Discussed safe use of OTC anti-diarrheals, no more than 3 days at a time.   - POCT urine pregnancy - negative - POCT Urinalysis Dipstick- negative  -  CBC with Differential - COMPLETE METABOLIC PANEL WITH GFR - Lipase - Urine cytology ancillary only  2. Right wrist pain Rest, Ice, NSAIDs- relayed risk and safe use of NSAIDs - Wrist splint  3. Major depression, recurrent, chronic (HCC) Improving, discussed counseling and coming off medication in 2-4 months if mood is still good.  - escitalopram (LEXAPRO) 10 MG tablet; Take 1 tablet (10 mg total) by mouth daily.  Dispense: 90 tablet; Refill: 0   -Red flags and when to present for emergency care or RTC including fever >101.49F, chest pain, shortness of breath, new/worsening/un-resolving symptoms, blood in stools, palpitations reviewed with patient at time of visit. Follow up and care instructions discussed and provided in AVS.   --------------------------------- I have reviewed this encounter including the documentation in this note and/or discussed this patient with the provider, Sharyon Cable DNP AGNP-C. I am certifying that I agree with the content of this note as supervising physician. Baruch Gouty, MD [facility] 06/23/2018, 6:59 PM

## 2018-06-24 LAB — URINE CYTOLOGY ANCILLARY ONLY
CHLAMYDIA, DNA PROBE: NEGATIVE
NEISSERIA GONORRHEA: NEGATIVE

## 2018-07-04 ENCOUNTER — Other Ambulatory Visit: Payer: Self-pay | Admitting: Nurse Practitioner

## 2018-07-04 ENCOUNTER — Ambulatory Visit: Payer: BLUE CROSS/BLUE SHIELD | Admitting: Nurse Practitioner

## 2019-03-27 IMAGING — CT CT HEAD W/O CM
1 series · 16 of 30 positions shown, 20 images · non-contrast
Comparison: None.

CLINICAL DATA: Worsening intractable headaches.

EXAM:
CT HEAD WITHOUT CONTRAST
TECHNIQUE: Contiguous axial images were obtained from the base of the skull
through the vertex without intravenous contrast.

[Series 2: head wo · axial · 0.42mm/px · z∈[-150,-16]mm · 16 of 30 slices shown, 20 images]
[im 2/30  brain]
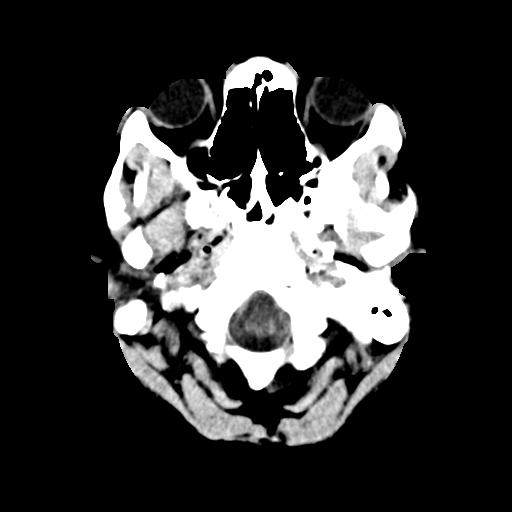
[im 2/30  bone]
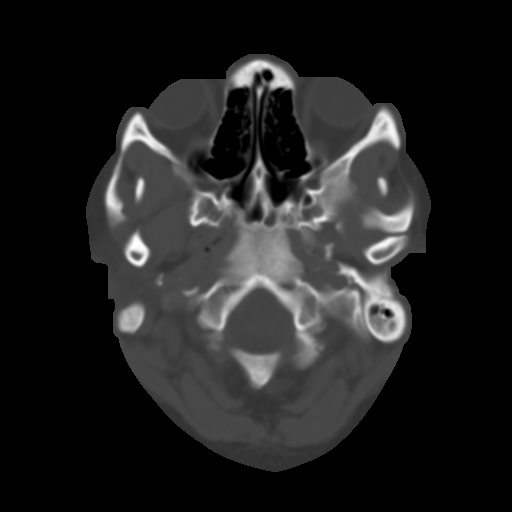
[im 4/30  brain]
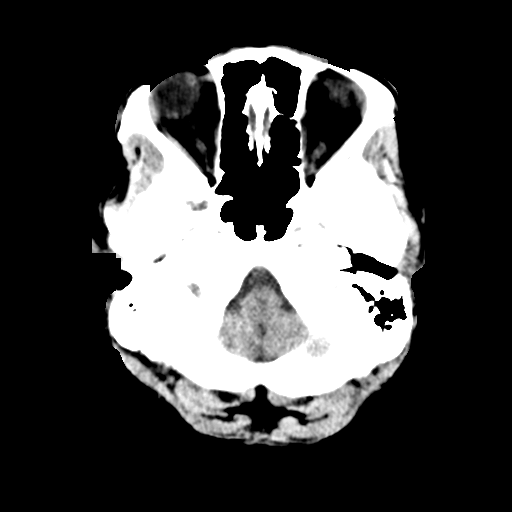
[im 6/30  brain]
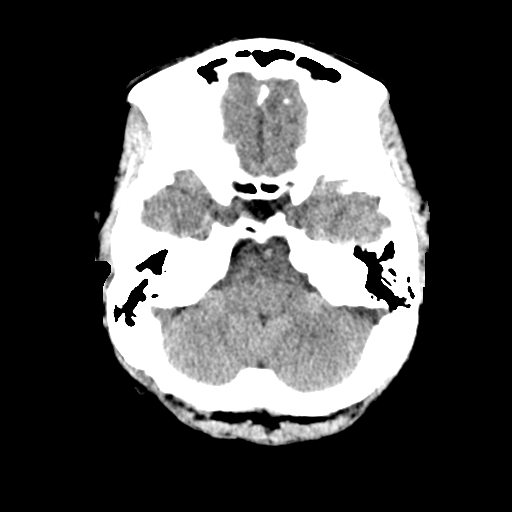
[im 8/30  brain]
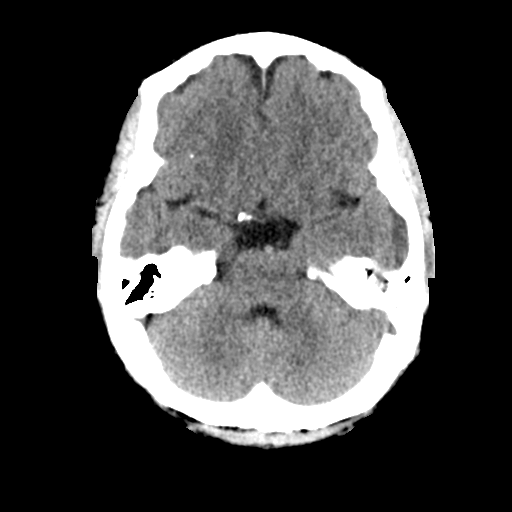
[im 9/30  brain]
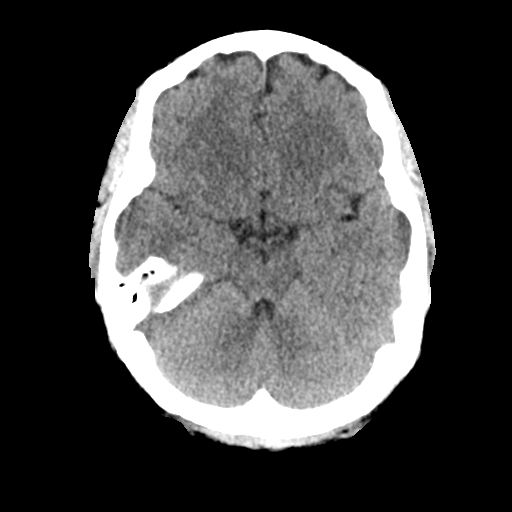
[im 9/30  bone]
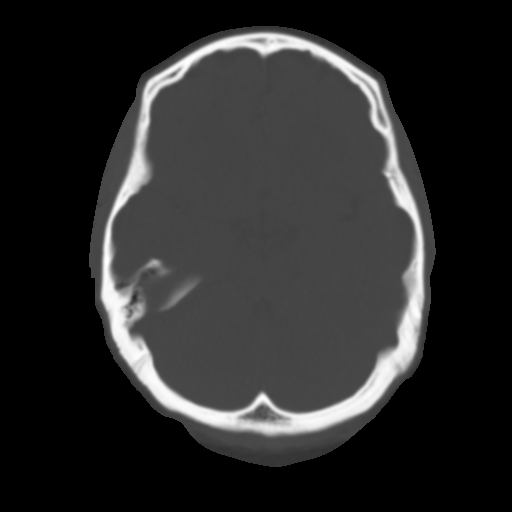
[im 11/30  brain]
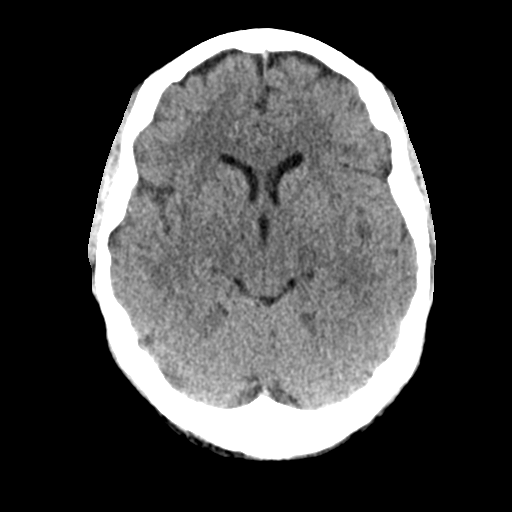
[im 13/30  brain]
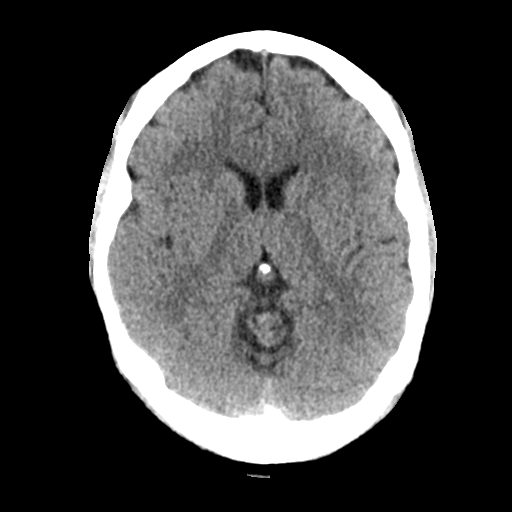
[im 15/30  brain]
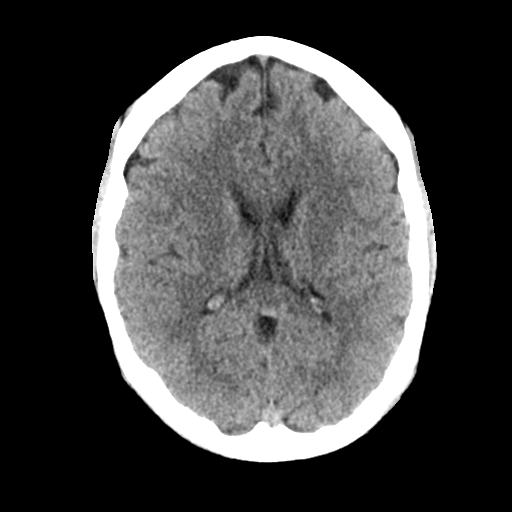
[im 16/30  brain]
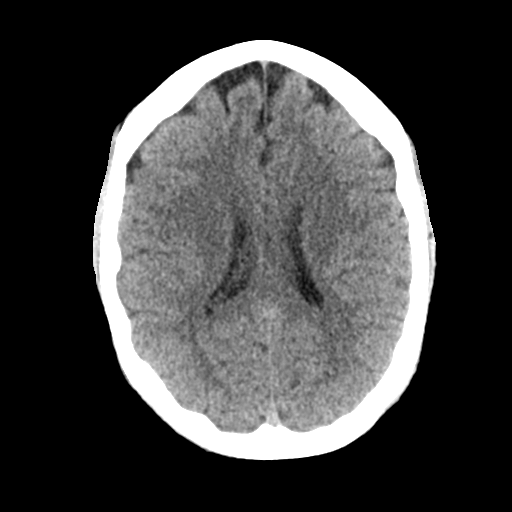
[im 16/30  bone]
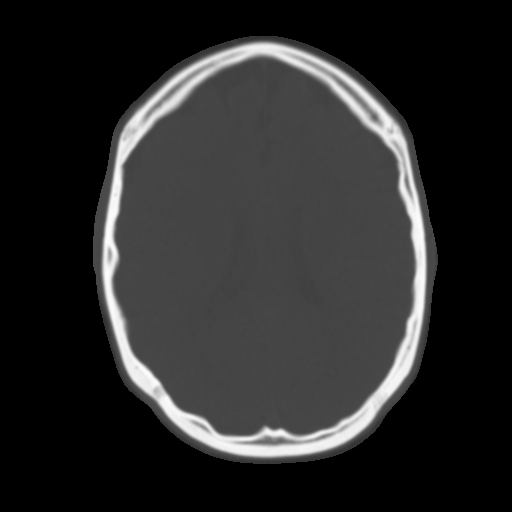
[im 18/30  brain]
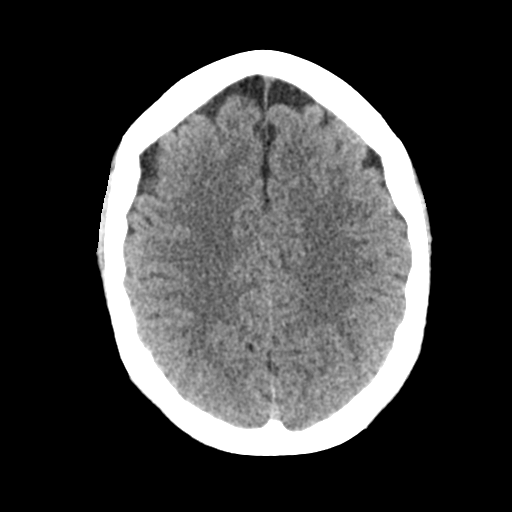
[im 20/30  brain]
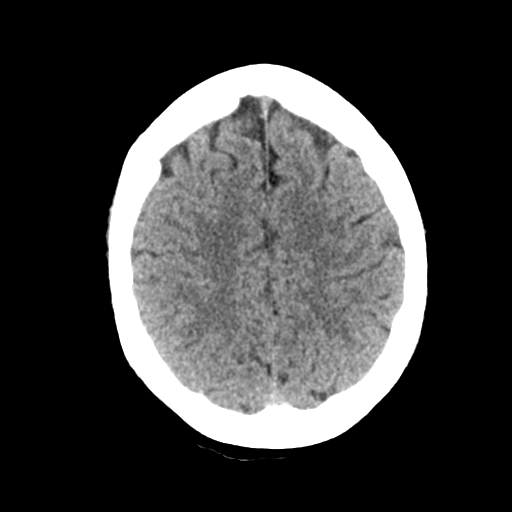
[im 22/30  brain]
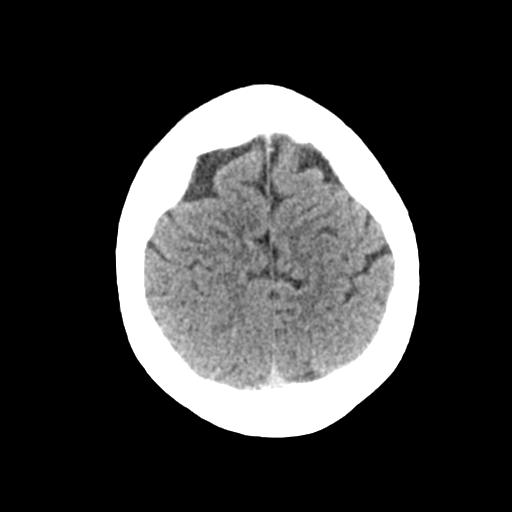
[im 23/30  brain]
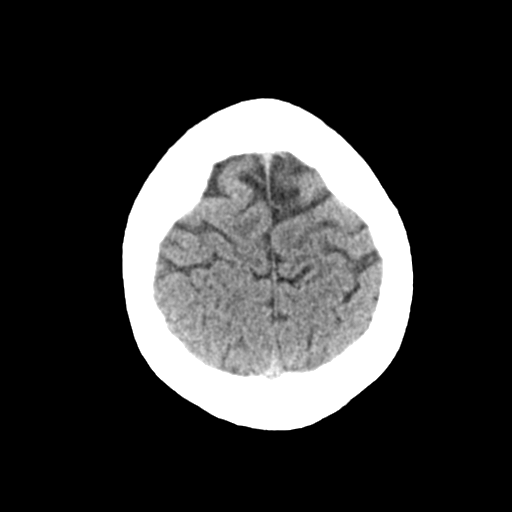
[im 23/30  bone]
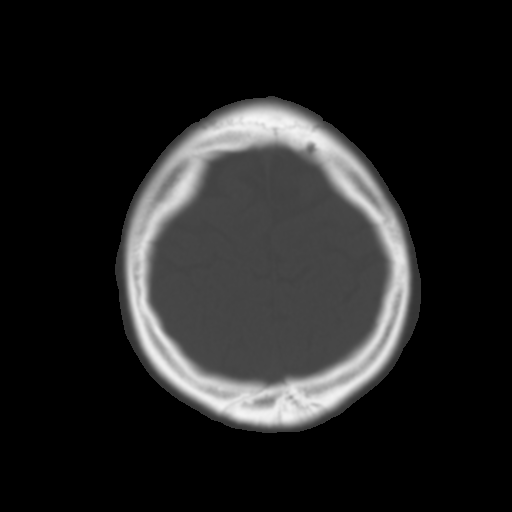
[im 25/30  brain]
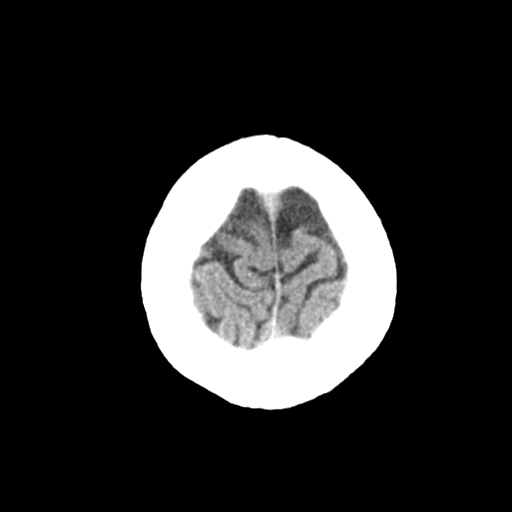
[im 27/30  brain]
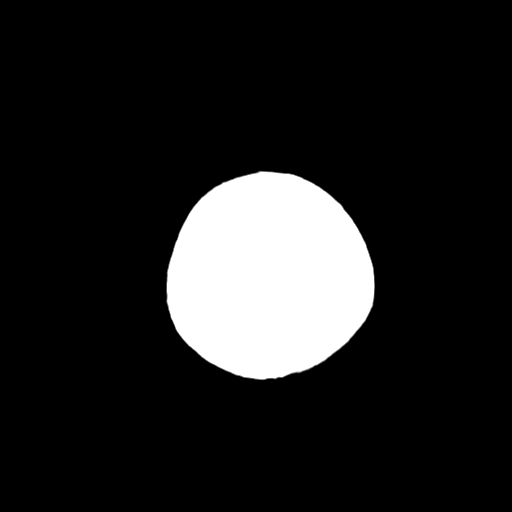
[im 29/30  brain]
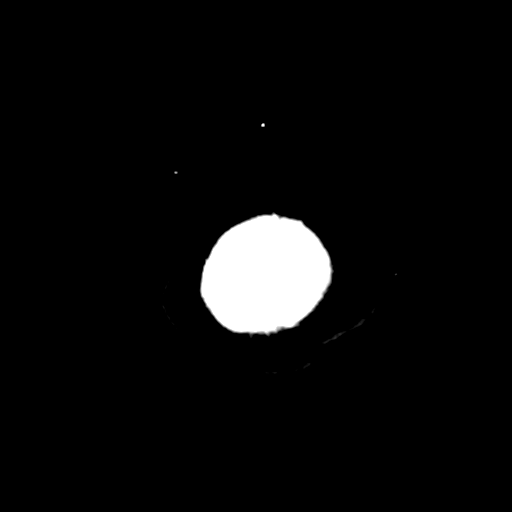

[16 of 30 positions shown; findings below may reference images not displayed]

FINDINGS: Brain: No evidence of acute infarction, hemorrhage, hydrocephalus,
extra-axial collection or mass lesion/mass effect.

Vascular: No hyperdense vessel or unexpected calcification.

Skull: Normal. Negative for fracture or focal lesion.

Sinuses/Orbits: No acute finding.

Other: None.
IMPRESSION: Normal head CT.

## 2020-10-08 ENCOUNTER — Ambulatory Visit: Payer: Self-pay

## 2023-07-04 ENCOUNTER — Ambulatory Visit
Admission: EM | Admit: 2023-07-04 | Discharge: 2023-07-04 | Disposition: A | Discharge status: Home / Self Care | Payer: BC Managed Care – PPO | Attending: Internal Medicine | Admitting: Internal Medicine

## 2023-07-04 ENCOUNTER — Encounter: Payer: Self-pay | Admitting: *Deleted

## 2023-07-04 ENCOUNTER — Other Ambulatory Visit: Payer: Self-pay

## 2023-07-04 DIAGNOSIS — J069 Acute upper respiratory infection, unspecified: Secondary | ICD-10-CM

## 2023-07-04 DIAGNOSIS — R591 Generalized enlarged lymph nodes: Secondary | ICD-10-CM | POA: Diagnosis not present

## 2023-07-04 LAB — POCT MONO SCREEN (KUC): Mono, POC: NEGATIVE

## 2023-07-04 MED ORDER — CEPHALEXIN 500 MG PO CAPS: 1000.0000 mg | ORAL_CAPSULE | Freq: Two times a day (BID) | ORAL | 0 refills | Status: AC

## 2023-07-04 NOTE — ED Provider Notes (Addendum)
Renaldo Fiddler    CSN: [id number] Arrival date & time: 07/04/23  0802      History   Chief Complaint Chief Complaint  Patient presents with   Chills   Generalized Body Aches   Lymphadenopathy    HPI Kristina Krueger is a 27 y.o. female who presents with L gland swollen glands x 2 weeks. Has been fatigued. Denies fever, cough or rhinitis. She started getting nose congestion and decreased appetite 3 days ago. She has a PCP but when she called them 2 weeks ago, they could not see her for a month. Denies ST or dental issues or cold sores. Had sweating last night and was feeling hot.  Had a negative in home covid test last night.    Past Medical History:  Diagnosis Date   Cholelithiases    Hidradenitis suppurativa     Patient Active Problem List   Diagnosis Date Noted   Obesity (BMI 30-39.9) 05/24/2018   Major depression, recurrent, chronic (HCC) 05/24/2018   Other insomnia 05/24/2018   Chronic tension-type headache, not intractable 01/29/2017   Hidradenitis suppurativa 09/27/2015    Past Surgical History:  Procedure Laterality Date   CHOLECYSTECTOMY N/A 04/23/2017   Procedure: LAPAROSCOPIC CHOLECYSTECTOMY;  Surgeon: Nadeen Landau, MD;  Location: [facility] ORS;  Service: General;  Laterality: N/A;   FOOT SURGERY     TONSILLECTOMY      OB History   No obstetric history on file.      Home Medications    Prior to Admission medications   Medication Sig Start Date End Date Taking? Authorizing Provider  cephALEXin (KEFLEX) 500 MG capsule Take 2 capsules (1,000 mg total) by mouth 2 (two) times daily. 07/04/23  Yes Rodriguez-Southworth, Nettie Elm, PA-C    Family History Family History  Problem Relation Age of Onset   Hypertension Father    Hyperthyroidism Maternal Grandmother     Social History Social History   Tobacco Use   Smoking status: Never   Smokeless tobacco: Never  Vaping Use   Vaping status: Never Used  Substance Use Topics   Alcohol use: No    Drug use: No     Allergies   Patient has no known allergies.   Review of Systems Review of Systems As noted in HPI  Physical Exam Triage Vital Signs ED Triage Vitals  Encounter Vitals Group     BP 07/04/23 0822 128/80     Systolic BP Percentile --      Diastolic BP Percentile --      Pulse Rate 07/04/23 0822 (!) 108     Resp 07/04/23 0822 20     Temp 07/04/23 0822 98.5 F (36.9 C)     Temp src --      SpO2 07/04/23 0822 96 %     Weight --      Height --      Head Circumference --      Peak Flow --      Pain Score 07/04/23 0818 10     Pain Loc --      Pain Education --      Exclude from Growth Chart --    No data found.  Updated Vital Signs BP 128/80   Pulse (!) 108   Temp 98.5 F (36.9 C)   Resp 20   LMP 06/15/2023   SpO2 96%   Visual Acuity Right Eye Distance:   Left Eye Distance:   Bilateral Distance:    Right Eye Near:  Left Eye Near:    Bilateral Near:     Physical Exam Vitals and nursing note reviewed.  Constitutional:      General: She is not in acute distress.    Appearance: She is obese. She is not toxic-appearing.  HENT:     Right Ear: Tympanic membrane, ear canal and external ear normal.     Left Ear: Tympanic membrane, ear canal and external ear normal.     Nose: Congestion present. No rhinorrhea.     Mouth/Throat:     Mouth: Mucous membranes are moist.     Dentition: Normal dentition. No gingival swelling, dental caries, dental abscesses or gum lesions.     Tongue: No lesions.     Pharynx: Oropharynx is clear. No oropharyngeal exudate or posterior oropharyngeal erythema.     Tonsils: No tonsillar exudate or tonsillar abscesses.  Eyes:     General: No scleral icterus.    Conjunctiva/sclera: Conjunctivae normal.  Neck:     Comments: Has tender L submandibular node and submental nodes. Skin is intact.  Cardiovascular:     Rate and Rhythm: Regular rhythm. Tachycardia present.     Heart sounds: No murmur heard. Pulmonary:      Effort: Pulmonary effort is normal.     Breath sounds: Normal breath sounds.  Abdominal:     General: Bowel sounds are normal.     Palpations: Abdomen is soft.     Tenderness: There is no guarding or rebound.     Comments: Has mild tenderness on LUQ  Musculoskeletal:        General: Normal range of motion.     Cervical back: Neck supple.  Skin:    General: Skin is warm and dry.     Findings: No rash.  Neurological:     Mental Status: She is alert and oriented to person, place, and time.     Gait: Gait normal.  Psychiatric:        Mood and Affect: Mood normal.        Behavior: Behavior normal.        Thought Content: Thought content normal.        Judgment: Judgment normal.      UC Treatments / Results  Labs (all labs ordered are listed, but only abnormal results are displayed) Labs Reviewed  POCT MONO SCREEN [facility])    EKG   Radiology No results found.  Procedures Procedures (including critical care time)  Medications Ordered in UC Medications - No data to display  Initial Impression / Assessment and Plan / UC Course  I have reviewed the triage vital signs and the nursing notes.  Pertinent labs  results that were available during my care of the patient were reviewed by me and considered in my medical decision making (see chart for details).  Lymphadenitis URI  I placed her on Keflex as noted Advised to call her PCP for FU and lab work if her nodes are not getting smaller with the antibiotic.    Final Clinical Impressions(s) / UC Diagnoses   Final diagnoses:  Lymphadenopathy  Acute upper respiratory infection     Discharge Instructions      Please call your PCP for FU next week, and if you are not getting better they can ran blood work for further evaluation.      ED Prescriptions     Medication Sig Dispense Auth. Provider   cephALEXin (KEFLEX) 500 MG capsule Take 2 capsules (1,000 mg total) by mouth 2 (two)  times daily. 40 capsule  Rodriguez-Southworth, Nettie Elm, PA-C      PDMP not reviewed this encounter.   Garey Ham, Cordelia Poche 07/04/23 2440    Rodriguez-Southworth, Nettie Elm, PA-C 07/04/23 [phone]

## 2023-07-04 NOTE — Discharge Instructions (Signed)
Please call your PCP for FU next week, and if you are not getting better they can ran blood work for further evaluation.

## 2023-07-04 NOTE — ED Triage Notes (Signed)
Pt reports for 2 weeks she has had chills,general  body aches and swollen neck lymph nodes on Lt. Pt also reports her hair has been falling out.
